# Patient Record
Sex: Female | Born: 1975 | Race: Black or African American | Hispanic: No | Marital: Single | Smoking: Current every day smoker
Health system: Southern US, Community
[De-identification: ages and names within clinical notes are randomized; demographics above are authoritative.]

## PROBLEM LIST (undated history)

## (undated) DIAGNOSIS — I10 Essential (primary) hypertension: Secondary | ICD-10-CM

## (undated) DIAGNOSIS — J45909 Unspecified asthma, uncomplicated: Secondary | ICD-10-CM

## (undated) HISTORY — PX: TUBAL LIGATION: SHX77

---

## 1994-02-11 DIAGNOSIS — B20 Human immunodeficiency virus [HIV] disease: Secondary | ICD-10-CM

## 1997-06-15 ENCOUNTER — Emergency Department (HOSPITAL_COMMUNITY): Admission: EM | Admit: 1997-06-15 | Discharge: 1997-06-15 | Payer: Self-pay | Admitting: Emergency Medicine

## 1997-07-01 ENCOUNTER — Emergency Department (HOSPITAL_COMMUNITY): Admission: EM | Admit: 1997-07-01 | Discharge: 1997-07-01 | Payer: Self-pay | Admitting: *Deleted

## 1997-09-06 ENCOUNTER — Other Ambulatory Visit: Admission: RE | Admit: 1997-09-06 | Discharge: 1997-09-06 | Payer: Self-pay | Admitting: Family Medicine

## 1998-08-16 ENCOUNTER — Emergency Department (HOSPITAL_COMMUNITY): Admission: EM | Admit: 1998-08-16 | Discharge: 1998-08-16 | Payer: Self-pay | Admitting: Emergency Medicine

## 1998-08-16 ENCOUNTER — Encounter: Payer: Self-pay | Admitting: Emergency Medicine

## 1999-01-22 ENCOUNTER — Encounter: Payer: Self-pay | Admitting: Emergency Medicine

## 1999-01-22 ENCOUNTER — Emergency Department (HOSPITAL_COMMUNITY): Admission: EM | Admit: 1999-01-22 | Discharge: 1999-01-22 | Payer: Self-pay | Admitting: Emergency Medicine

## 1999-03-15 ENCOUNTER — Encounter (INDEPENDENT_AMBULATORY_CARE_PROVIDER_SITE_OTHER): Payer: Self-pay | Admitting: *Deleted

## 1999-03-15 LAB — CONVERTED CEMR LAB: CD4 T Cell Abs: 820

## 1999-04-13 ENCOUNTER — Ambulatory Visit (HOSPITAL_COMMUNITY): Admission: RE | Admit: 1999-04-13 | Discharge: 1999-04-13 | Payer: Self-pay | Admitting: Family Medicine

## 1999-05-02 ENCOUNTER — Encounter: Admission: RE | Admit: 1999-05-02 | Discharge: 1999-05-02 | Payer: Self-pay | Admitting: Obstetrics & Gynecology

## 1999-05-16 ENCOUNTER — Encounter: Admission: RE | Admit: 1999-05-16 | Discharge: 1999-05-16 | Payer: Self-pay | Admitting: Obstetrics & Gynecology

## 1999-05-23 ENCOUNTER — Encounter: Admission: RE | Admit: 1999-05-23 | Discharge: 1999-05-23 | Payer: Self-pay | Admitting: Obstetrics & Gynecology

## 1999-05-23 ENCOUNTER — Ambulatory Visit (HOSPITAL_COMMUNITY): Admission: RE | Admit: 1999-05-23 | Discharge: 1999-05-23 | Payer: Self-pay | Admitting: Obstetrics

## 1999-05-30 ENCOUNTER — Ambulatory Visit (HOSPITAL_COMMUNITY): Admission: RE | Admit: 1999-05-30 | Discharge: 1999-05-30 | Payer: Self-pay | Admitting: Obstetrics

## 1999-05-30 ENCOUNTER — Encounter: Admission: RE | Admit: 1999-05-30 | Discharge: 1999-05-30 | Payer: Self-pay | Admitting: Obstetrics & Gynecology

## 1999-06-06 ENCOUNTER — Encounter: Admission: RE | Admit: 1999-06-06 | Discharge: 1999-06-06 | Payer: Self-pay | Admitting: Obstetrics & Gynecology

## 1999-06-11 ENCOUNTER — Encounter: Admission: RE | Admit: 1999-06-11 | Discharge: 1999-06-11 | Payer: Self-pay | Admitting: Internal Medicine

## 1999-06-20 ENCOUNTER — Encounter: Admission: RE | Admit: 1999-06-20 | Discharge: 1999-06-20 | Payer: Self-pay | Admitting: Obstetrics & Gynecology

## 1999-06-20 ENCOUNTER — Encounter (HOSPITAL_COMMUNITY): Admission: RE | Admit: 1999-06-20 | Discharge: 1999-08-31 | Payer: Self-pay | Admitting: Obstetrics & Gynecology

## 1999-06-27 ENCOUNTER — Encounter: Admission: RE | Admit: 1999-06-27 | Discharge: 1999-06-27 | Payer: Self-pay | Admitting: Obstetrics & Gynecology

## 1999-06-27 ENCOUNTER — Ambulatory Visit (HOSPITAL_COMMUNITY): Admission: RE | Admit: 1999-06-27 | Discharge: 1999-06-27 | Payer: Self-pay | Admitting: Obstetrics & Gynecology

## 1999-07-03 ENCOUNTER — Encounter: Admission: RE | Admit: 1999-07-03 | Discharge: 1999-07-03 | Payer: Self-pay | Admitting: Internal Medicine

## 1999-07-11 ENCOUNTER — Encounter: Admission: RE | Admit: 1999-07-11 | Discharge: 1999-07-11 | Payer: Self-pay | Admitting: Obstetrics & Gynecology

## 1999-07-11 ENCOUNTER — Ambulatory Visit (HOSPITAL_COMMUNITY): Admission: RE | Admit: 1999-07-11 | Discharge: 1999-07-11 | Payer: Self-pay | Admitting: Obstetrics

## 1999-07-13 ENCOUNTER — Encounter: Payer: Self-pay | Admitting: Obstetrics & Gynecology

## 1999-07-25 ENCOUNTER — Encounter: Admission: RE | Admit: 1999-07-25 | Discharge: 1999-07-25 | Payer: Self-pay | Admitting: Obstetrics

## 1999-08-01 ENCOUNTER — Encounter: Admission: RE | Admit: 1999-08-01 | Discharge: 1999-08-01 | Payer: Self-pay | Admitting: Obstetrics

## 1999-08-01 ENCOUNTER — Inpatient Hospital Stay (HOSPITAL_COMMUNITY): Admission: AD | Admit: 1999-08-01 | Discharge: 1999-08-01 | Payer: Self-pay | Admitting: *Deleted

## 1999-08-13 ENCOUNTER — Encounter (INDEPENDENT_AMBULATORY_CARE_PROVIDER_SITE_OTHER): Payer: Self-pay | Admitting: *Deleted

## 1999-08-13 ENCOUNTER — Encounter: Admission: RE | Admit: 1999-08-13 | Discharge: 1999-08-13 | Payer: Self-pay | Admitting: Internal Medicine

## 1999-08-22 ENCOUNTER — Encounter: Admission: RE | Admit: 1999-08-22 | Discharge: 1999-08-22 | Payer: Self-pay | Admitting: Obstetrics

## 1999-08-31 ENCOUNTER — Inpatient Hospital Stay (HOSPITAL_COMMUNITY): Admission: AD | Admit: 1999-08-31 | Discharge: 1999-09-03 | Payer: Self-pay | Admitting: *Deleted

## 1999-08-31 ENCOUNTER — Encounter (INDEPENDENT_AMBULATORY_CARE_PROVIDER_SITE_OTHER): Payer: Self-pay | Admitting: Specialist

## 1999-09-25 ENCOUNTER — Encounter: Admission: RE | Admit: 1999-09-25 | Discharge: 1999-09-25 | Payer: Self-pay | Admitting: Internal Medicine

## 2001-07-07 ENCOUNTER — Emergency Department (HOSPITAL_COMMUNITY): Admission: EM | Admit: 2001-07-07 | Discharge: 2001-07-07 | Payer: Self-pay | Admitting: Emergency Medicine

## 2002-11-12 ENCOUNTER — Emergency Department (HOSPITAL_COMMUNITY): Admission: EM | Admit: 2002-11-12 | Discharge: 2002-11-12 | Payer: Self-pay | Admitting: Emergency Medicine

## 2003-07-13 ENCOUNTER — Emergency Department (HOSPITAL_COMMUNITY): Admission: EM | Admit: 2003-07-13 | Discharge: 2003-07-14 | Payer: Self-pay | Admitting: Emergency Medicine

## 2004-02-28 ENCOUNTER — Encounter (INDEPENDENT_AMBULATORY_CARE_PROVIDER_SITE_OTHER): Payer: Self-pay | Admitting: *Deleted

## 2004-02-28 ENCOUNTER — Ambulatory Visit (HOSPITAL_COMMUNITY): Admission: RE | Admit: 2004-02-28 | Discharge: 2004-02-28 | Payer: Self-pay | Admitting: Internal Medicine

## 2004-02-28 ENCOUNTER — Ambulatory Visit: Payer: Self-pay | Admitting: Internal Medicine

## 2004-02-28 LAB — CONVERTED CEMR LAB: CD4 Count: 740 microliters

## 2004-03-27 ENCOUNTER — Ambulatory Visit: Payer: Self-pay | Admitting: Internal Medicine

## 2004-11-27 ENCOUNTER — Emergency Department (HOSPITAL_COMMUNITY): Admission: EM | Admit: 2004-11-27 | Discharge: 2004-11-27 | Payer: Self-pay | Admitting: Emergency Medicine

## 2004-11-30 ENCOUNTER — Emergency Department (HOSPITAL_COMMUNITY): Admission: EM | Admit: 2004-11-30 | Discharge: 2004-11-30 | Payer: Self-pay | Admitting: Family Medicine

## 2004-12-03 ENCOUNTER — Emergency Department (HOSPITAL_COMMUNITY): Admission: EM | Admit: 2004-12-03 | Discharge: 2004-12-03 | Payer: Self-pay | Admitting: Emergency Medicine

## 2005-03-02 ENCOUNTER — Emergency Department (HOSPITAL_COMMUNITY): Admission: EM | Admit: 2005-03-02 | Discharge: 2005-03-02 | Payer: Self-pay | Admitting: Emergency Medicine

## 2005-11-19 ENCOUNTER — Encounter: Admission: RE | Admit: 2005-11-19 | Discharge: 2005-11-19 | Payer: Self-pay | Admitting: Internal Medicine

## 2005-11-19 ENCOUNTER — Encounter (INDEPENDENT_AMBULATORY_CARE_PROVIDER_SITE_OTHER): Payer: Self-pay | Admitting: *Deleted

## 2005-11-19 ENCOUNTER — Ambulatory Visit: Payer: Self-pay | Admitting: Internal Medicine

## 2005-11-19 DIAGNOSIS — E669 Obesity, unspecified: Secondary | ICD-10-CM | POA: Insufficient documentation

## 2005-11-19 DIAGNOSIS — F17200 Nicotine dependence, unspecified, uncomplicated: Secondary | ICD-10-CM | POA: Insufficient documentation

## 2005-11-19 DIAGNOSIS — D696 Thrombocytopenia, unspecified: Secondary | ICD-10-CM

## 2005-11-19 LAB — CONVERTED CEMR LAB
HIV 1 RNA Quant: 268 copies/mL
HIV-1 RNA Quant, Log: 2.43 — ABNORMAL HIGH (ref <1.70)

## 2005-12-12 DIAGNOSIS — I10 Essential (primary) hypertension: Secondary | ICD-10-CM | POA: Insufficient documentation

## 2006-04-07 ENCOUNTER — Encounter (INDEPENDENT_AMBULATORY_CARE_PROVIDER_SITE_OTHER): Payer: Self-pay | Admitting: *Deleted

## 2006-04-07 LAB — CONVERTED CEMR LAB

## 2006-04-20 ENCOUNTER — Encounter (INDEPENDENT_AMBULATORY_CARE_PROVIDER_SITE_OTHER): Payer: Self-pay | Admitting: *Deleted

## 2006-12-09 ENCOUNTER — Telehealth: Payer: Self-pay | Admitting: Internal Medicine

## 2007-01-26 ENCOUNTER — Encounter (INDEPENDENT_AMBULATORY_CARE_PROVIDER_SITE_OTHER): Payer: Self-pay | Admitting: *Deleted

## 2007-01-27 ENCOUNTER — Ambulatory Visit: Payer: Self-pay | Admitting: Internal Medicine

## 2007-01-27 ENCOUNTER — Encounter: Admission: RE | Admit: 2007-01-27 | Discharge: 2007-01-27 | Payer: Self-pay | Admitting: Internal Medicine

## 2007-01-27 LAB — CONVERTED CEMR LAB
Albumin: 3.9 g/dL (ref 3.5–5.2)
BUN: 10 mg/dL (ref 6–23)
CO2: 22 meq/L (ref 19–32)
Calcium: 8.8 mg/dL (ref 8.4–10.5)
Chloride: 107 meq/L (ref 96–112)
Eosinophils Absolute: 0.2 10*3/uL (ref 0.2–0.7)
Glucose, Bld: 92 mg/dL (ref 70–99)
HDL: 37 mg/dL — ABNORMAL LOW (ref >39)
HIV-1 RNA Quant, Log: 2.87 — ABNORMAL HIGH (ref <1.70)
Lymphocytes Relative: 61 % — ABNORMAL HIGH (ref 12–46)
Lymphs Abs: 3.1 10*3/uL (ref 0.7–4.0)
MCV: 79.5 fL (ref 78.0–100.0)
Monocytes Relative: 7 % (ref 3–12)
Neutro Abs: 1.4 10*3/uL — ABNORMAL LOW (ref 1.7–7.7)
Neutrophils Relative %: 27 % — ABNORMAL LOW (ref 43–77)
Potassium: 4.4 meq/L (ref 3.5–5.3)
RBC: 4.88 M/uL (ref 3.87–5.11)
Sodium: 138 meq/L (ref 135–145)
Total Protein: 7.7 g/dL (ref 6.0–8.3)
Triglycerides: 72 mg/dL (ref <150)
WBC: 5.1 10*3/uL (ref 4.0–10.5)

## 2007-04-06 ENCOUNTER — Emergency Department (HOSPITAL_COMMUNITY): Admission: EM | Admit: 2007-04-06 | Discharge: 2007-04-06 | Payer: Self-pay | Admitting: Family Medicine

## 2007-07-08 ENCOUNTER — Encounter: Payer: Self-pay | Admitting: Internal Medicine

## 2008-04-05 ENCOUNTER — Emergency Department (HOSPITAL_COMMUNITY): Admission: EM | Admit: 2008-04-05 | Discharge: 2008-04-05 | Payer: Self-pay | Admitting: Emergency Medicine

## 2008-04-26 ENCOUNTER — Encounter: Payer: Self-pay | Admitting: Internal Medicine

## 2008-04-27 ENCOUNTER — Encounter (INDEPENDENT_AMBULATORY_CARE_PROVIDER_SITE_OTHER): Payer: Self-pay | Admitting: *Deleted

## 2008-05-06 ENCOUNTER — Ambulatory Visit: Payer: Self-pay | Admitting: Internal Medicine

## 2008-05-06 ENCOUNTER — Other Ambulatory Visit: Payer: Self-pay | Admitting: Internal Medicine

## 2008-05-06 ENCOUNTER — Telehealth: Payer: Self-pay | Admitting: Internal Medicine

## 2008-05-06 ENCOUNTER — Encounter: Payer: Self-pay | Admitting: Internal Medicine

## 2008-05-06 LAB — CONVERTED CEMR LAB
ALT: 18 units/L (ref 0–35)
Albumin: 3.8 g/dL (ref 3.5–5.2)
Alkaline Phosphatase: 101 units/L (ref 39–117)
Basophils Absolute: 0 10*3/uL (ref 0.0–0.1)
Basophils Relative: 0 % (ref 0–1)
CO2: 22 meq/L (ref 19–32)
Eosinophils Relative: 3 % (ref 0–5)
GFR calc Af Amer: >60 mL/min (ref >60)
GFR calc non Af Amer: >60 mL/min (ref >60)
Glucose, Bld: 94 mg/dL (ref 70–99)
HCT: 35.8 % — ABNORMAL LOW (ref 36.0–46.0)
HIV 1 RNA Quant: 1230 copies/mL — ABNORMAL HIGH (ref <48)
HIV-1 RNA Quant, Log: 3.09 — ABNORMAL HIGH (ref <1.68)
Hemoglobin: 12.9 g/dL (ref 12.0–15.0)
LDL Cholesterol: 50 mg/dL (ref 0–99)
Lymphocytes Relative: 46 % (ref 12–46)
MCHC: 36 g/dL (ref 30.0–36.0)
Monocytes Absolute: 0.9 10*3/uL (ref 0.1–1.0)
Platelets: 101 10*3/uL — ABNORMAL LOW (ref 150–400)
Potassium: 3.9 meq/L (ref 3.5–5.3)
RDW: 14.4 % (ref 11.5–15.5)
Sodium: 141 meq/L (ref 135–145)
Total Bilirubin: 0.3 mg/dL (ref 0.3–1.2)
Total Protein: 7.5 g/dL (ref 6.0–8.3)
VLDL: 25 mg/dL (ref 0–40)

## 2008-05-11 ENCOUNTER — Encounter: Payer: Self-pay | Admitting: Internal Medicine

## 2008-07-05 ENCOUNTER — Telehealth (INDEPENDENT_AMBULATORY_CARE_PROVIDER_SITE_OTHER): Payer: Self-pay | Admitting: *Deleted

## 2008-09-10 ENCOUNTER — Emergency Department (HOSPITAL_COMMUNITY): Admission: EM | Admit: 2008-09-10 | Discharge: 2008-09-10 | Payer: Self-pay | Admitting: Emergency Medicine

## 2008-09-12 ENCOUNTER — Emergency Department (HOSPITAL_COMMUNITY): Admission: EM | Admit: 2008-09-12 | Discharge: 2008-09-12 | Payer: Self-pay | Admitting: Emergency Medicine

## 2008-09-14 ENCOUNTER — Emergency Department (HOSPITAL_COMMUNITY): Admission: EM | Admit: 2008-09-14 | Discharge: 2008-09-14 | Payer: Self-pay | Admitting: Emergency Medicine

## 2008-09-19 ENCOUNTER — Emergency Department (HOSPITAL_COMMUNITY): Admission: EM | Admit: 2008-09-19 | Discharge: 2008-09-19 | Payer: Self-pay | Admitting: Family Medicine

## 2009-02-27 ENCOUNTER — Emergency Department (HOSPITAL_COMMUNITY): Admission: EM | Admit: 2009-02-27 | Discharge: 2009-02-27 | Payer: Self-pay | Admitting: Emergency Medicine

## 2009-03-02 ENCOUNTER — Emergency Department (HOSPITAL_COMMUNITY): Admission: EM | Admit: 2009-03-02 | Discharge: 2009-03-02 | Payer: Self-pay | Admitting: Emergency Medicine

## 2009-08-18 ENCOUNTER — Encounter: Payer: Self-pay | Admitting: Internal Medicine

## 2009-11-02 ENCOUNTER — Encounter: Payer: Self-pay | Admitting: Internal Medicine

## 2009-11-07 ENCOUNTER — Encounter: Payer: Self-pay | Admitting: Internal Medicine

## 2009-11-15 ENCOUNTER — Ambulatory Visit: Payer: Self-pay | Admitting: Internal Medicine

## 2009-11-15 LAB — CONVERTED CEMR LAB
ALT: 18 units/L (ref 0–35)
AST: 22 units/L (ref 0–37)
Albumin: 3.7 g/dL (ref 3.5–5.2)
Basophils Absolute: 0 10*3/uL (ref 0.0–0.1)
Basophils Relative: 0 % (ref 0–1)
CO2: 25 meq/L (ref 19–32)
Calcium: 8.4 mg/dL (ref 8.4–10.5)
Chloride: 108 meq/L (ref 96–112)
Lymphocytes Relative: 54 % — ABNORMAL HIGH (ref 12–46)
MCHC: 35.4 g/dL (ref 30.0–36.0)
Monocytes Relative: 5 % (ref 3–12)
Neutro Abs: 2.5 10*3/uL (ref 1.7–7.7)
Neutrophils Relative %: 37 % — ABNORMAL LOW (ref 43–77)
Potassium: 3.7 meq/L (ref 3.5–5.3)
RBC: 4.4 M/uL (ref 3.87–5.11)
RDW: 13.8 % (ref 11.5–15.5)

## 2009-11-27 ENCOUNTER — Emergency Department (HOSPITAL_COMMUNITY): Admission: EM | Admit: 2009-11-27 | Discharge: 2009-11-27 | Payer: Self-pay | Admitting: Emergency Medicine

## 2009-11-28 ENCOUNTER — Telehealth (INDEPENDENT_AMBULATORY_CARE_PROVIDER_SITE_OTHER): Payer: Self-pay | Admitting: *Deleted

## 2009-11-28 ENCOUNTER — Encounter: Payer: Self-pay | Admitting: Internal Medicine

## 2009-12-14 ENCOUNTER — Ambulatory Visit: Payer: Self-pay | Admitting: Internal Medicine

## 2009-12-14 DIAGNOSIS — A5901 Trichomonal vulvovaginitis: Secondary | ICD-10-CM

## 2010-01-12 ENCOUNTER — Ambulatory Visit: Payer: Self-pay | Admitting: Internal Medicine

## 2010-01-23 ENCOUNTER — Ambulatory Visit: Payer: Self-pay | Admitting: Internal Medicine

## 2010-03-14 ENCOUNTER — Encounter (INDEPENDENT_AMBULATORY_CARE_PROVIDER_SITE_OTHER): Payer: Self-pay | Admitting: *Deleted

## 2010-03-15 NOTE — Miscellaneous (Signed)
Summary: Orders Update - labs  Clinical Lists Changes  Orders: Added new Test order of T-CD4SP [facility]) (CD4SP) - Signed

## 2010-03-15 NOTE — Miscellaneous (Signed)
Summary: Orders Update - labs  Clinical Lists Changes  Orders: Added new Test order of T-CBC w/Diff [phone]) - Signed Added new Test order of T-CD4SP [facility]) (CD4SP) - Signed Added new Test order of T-GC Probe, urine [phone]) - Signed Added new Test order of T-Chlamydia  Probe, urine [phone]) - Signed Added new Test order of T-Comprehensive Metabolic Panel ([id number]) - Signed Added new Test order of T-HIV1 Quant rflx Ultra or Genotype ([id number]) - Signed Added new Test order of T-RPR (Syphilis) ([id number]) - Signed     Process Orders Check Orders Results:     [facility]: ABN not required for this insurance Tests Sent for requisitioning (November 07, 2009 3:51 PM):     11/15/2009: [facility] -- T-CBC w/Diff [[id number]] (signed)     11/15/2009: [facility] -- T-GC Probe, urine [phone] (signed)     11/15/2009: [facility] -- T-Chlamydia  Probe, urine [phone] (signed)     11/15/2009: [facility] -- T-Comprehensive Metabolic Panel [[id number]] (signed)     11/15/2009: [facility] -- T-HIV1 Quant rflx Ultra or Genotype [phone] (signed)     11/15/2009: [facility] -- T-RPR (Syphilis) [phone] (signed)

## 2010-03-15 NOTE — Progress Notes (Signed)
Summary: lab appt. 11/29/09 @ 2:00pm  Phone Note Outgoing Call   Call placed by: Jennet Maduro RN,  November 28, 2009 4:44 PM Call placed to: Patient Action Taken: Phone Call Completed, Appt scheduled Summary of Call: Message left for pt. reminding her of lab appt. for tomorrow, Wed., Oct. 19, 2011 @ 2:00 pm. Jennet Maduro RN  November 28, 2009 4:44 PM

## 2010-03-15 NOTE — Miscellaneous (Signed)
Summary: Orders Update - BMET  Clinical Lists Changes  Orders: Added new Test order of T-Basic Metabolic Panel [phone]) - Signed     Process Orders Check Orders Results:     [facility]: ABN not required for this insurance Tests Sent for requisitioning (November 28, 2009 4:40 PM):     11/29/2009: [facility] -- T-Basic Metabolic Panel [phone] (signed)

## 2010-03-15 NOTE — Miscellaneous (Signed)
Summary: Orders Update - referral for [facility]  Clinical Lists Changes  Orders: Added new Referral order of Misc. Referral (Misc. Ref) - Signed

## 2010-03-15 NOTE — Letter (Signed)
Summary: [facility] for Infectious Disease  [address]   [place], [place] [address]   Phone: [phone]  Fax: [phone]          August 18, 2009  [facility] [place] [address] [place], [place]  [address]  Dear Ms. Miron,  I attempted to reach you by phone today but was unable to leave you a message or speak with you.  Please call the number above and follow the prompts to reschedule your lab and PAP smear appointment.  Dr. Orvan Falconer will be able to better advise you when you come for your office visit on Thursday, July 14th at 10:30 when you lab values are available to him at the time of your visit.  This will save you time and transportation costs if you do not need to return to the clinic several times.  Please call the number above to reschedule these appointments.  Thank you.  Sincerely,    Jennet Maduro [facility] for Infectious Disease [address], [address] (across from the Emergency Department at [facility]

## 2010-03-15 NOTE — Assessment & Plan Note (Signed)
Summary: F/U OV/VS   CC:  follow-up visit and Depression.  History of Present Illness: Melissa Sloan is in for her first visit in 3 years.  She states that she has not been keeping her visits because she was afraid she would be able to pay her bills here and she had trouble with transportation.  However she does not have any problem taking the bus to work each day or getting the bus here today.  She says she is aware that we will work with her on any pills and want to see her regardless of her ability to pay.  She recently felt like she had a cold with fever and the car she did not have a car she called the ambulance to take her to the hospital.  She was seen in the emergency department and had a slightly low potassium which was repleted.  She mentioned a vaginal discharge and was found to have Trichomonas from which she was treated with Flagyl.  Her fianc has not been evaluated or treated yet and she is still having some vaginal discharge and itching.  They have been sexually active but she says he is HIV negative and that he always uses condoms.  She had been out of her hydrochlorothiazide for several years but restarted it recently.  Depression History:      The patient denies a depressed mood most of the day and a diminished interest in her usual daily activities.         Preventive Screening-Counseling & Management  Alcohol-Tobacco     Alcohol drinks/day: <1     Alcohol type: beer     Smoking Status: current     Smoking Cessation Counseling: yes     Smoke Cessation Stage: precontemplative     Packs/Day: 1/2ppd  Caffeine-Diet-Exercise     Caffeine use/day: 5+     Does Patient Exercise: yes     Type of exercise: walking, 20 minutes     Times/week: 7  Comments: interested stopping smoking  Hep-HIV-STD-Contraception     HIV Risk: no     HIV Risk Counseling: not indicated-no HIV risk noted  Safety-Violence-Falls     Seat Belt Use: 100  Comments: given condoms      Sexual History:   currently monogamous.        Drug Use:  former.     Updated Prior Medication List: HYDROCHLOROTHIAZIDE 50 MG TABS (HYDROCHLOROTHIAZIDE) Take 1 tablet by mouth once a day K-TABS 10 MEQ CR-TABS (POTASSIUM CHLORIDE) Take 1 tablet by mouth once a day  Current Allergies (reviewed today): No known allergies  Social History: Sexual History:  currently monogamous Drug Use:  former  Vital Signs:  Patient profile:   35 year old female Menstrual status:  regular LMP:     12/12/2009 Height:      62.5 inches (158.75 cm) Weight:      217.5 pounds (98.86 kg) BMI:     39.29 Temp:     98.5 degrees F (36.94 degrees C) oral Pulse rate:   86 / minute BP sitting:   139 / 96  (left arm) Cuff size:   large  Vitals Entered By: Jennet Maduro RN (December 14, 2009 11:06 AM) CC: follow-up visit, Depression Is Patient Diabetic? No Pain Assessment Patient in pain? no      Nutritional Status BMI of > 30 = obese Nutritional Status Detail appetite "I eat.  Have you ever been in a relationship where you felt threatened, hurt or afraid?No  Does patient need assistance? Functional Status Self care Ambulation Normal LMP (date): 12/12/2009 LMP - Character: normal    Menses interval (days): 30 Menstrual flow (days): 5 Enter LMP: 12/12/2009 Last PAP Result NEGATIVE FOR INTRAEPITHELIAL LESIONS OR MALIGNANCY.   Physical Exam  General:  alert and overweight-appearing.  alert.   Mouth:  pharynx pink and moist, no erythema, no exudates, and fair dentition.  pharynx pink and moist, no erythema, no exudates, and fair dentition.   Lungs:  normal breath sounds, no crackles, and no wheezes.  normal breath sounds, no crackles, and no wheezes.   Heart:  normal rate, regular rhythm, and no murmur.  normal rate, regular rhythm, and no murmur.   Abdomen:  soft, non-tender, normal bowel sounds, and no masses.  soft, non-tender, normal bowel sounds, and no masses.   Skin:  she has one time sized skin lesion on  her right shoulder with some central clearing and peripheral scale compatible with very mild tinea infection Inguinal Nodes:  no R inguinal adenopathy and no L inguinal adenopathy.  no R inguinal adenopathy and no L inguinal adenopathy.   Psych:  normally interactive, good eye contact, not anxious appearing, and not depressed appearing.  she is very loud and boisterous but todaynormally interactive, good eye contact, not anxious appearing, and not depressed appearing.          Medication Adherence: 12/14/2009   Adherence to medications reviewed with patient. Counseling to provide adequate adherence provided   Prevention For Positives: 12/14/2009   Safe sex practices discussed with patient. Condoms offered.         12/14/2009   Patient was screened for substance abuse and depression. Referal was made as indicated.                      Impression & Recommendations:  Problem # 1:  HIV DISEASE (ICD-042) Despite long absences from care her HIV remains stable and asymptomatic.  I will not start any antiretroviral therapy but will continue to work with her on staying in care. Diagnostics Reviewed:  HIV: HIV positive - not AIDS (04/27/2008)   CD4: 650 (11/17/2009)   WBC: 6.6 (11/15/2009)   Hgb: 12.3 (11/15/2009)   HCT: 34.7 (11/15/2009)   Platelets: 154 (11/15/2009) HIV genotype: See Comment (11/15/2009)   HIV-1 RNA: 2510 (11/15/2009)   HBSAg: NO (04/07/2006)    Her updated medication list for this problem includes:    Flagyl 500 Mg Tabs (Metronidazole) .Marland Kitchen... Take 4 tablets by mouth once  Problem # 2:  HYPERTENSION (ICD-401.9) I will continue her hydrochlorothiazide today, recheck her potassium and encouraged her to return soon for regular visits. Her updated medication list for this problem includes:    Hydrochlorothiazide 50 Mg Tabs (Hydrochlorothiazide) .Marland Kitchen... Take 1 tablet by mouth once a day    Her updated medication list for this problem includes:    Hydrochlorothiazide 50 Mg  Tabs (Hydrochlorothiazide) .Marland Kitchen... Take 1 tablet by mouth once a day  Orders: Est. Patient Level IV ([address])  Problem # 3:  TRICHOMONAL VAGINITIS (ICD-131.01)  I will treat her again for Trichomonas and I have strongly encouraged her to have her fianc to the health department for evaluation and treatment for STDs.  Orders: Est. Patient Level IV ([address])  Medications Added to Medication List This Visit: 1)  K-tabs 10 Meq Cr-tabs (Potassium chloride) .... Take 1 tablet by mouth once a day  Other Orders: Influenza Vaccine NON MCR ([address]) Pneumococcal Vaccine ([address]1478) Admin 1st Vaccine (  [address])  Patient Instructions: 1)  Please schedule an appointment for the Pap smear clinic. 2)  Please schedule a follow-up appointment in 6 weeks.    Immunizations Administered:  Influenza Vaccine # 1:    Vaccine Type: Fluvax Non-MCR    Site: left deltoid    Mfr: novartis    Dose: 0.5 ml    Route: IM    Given by: Jennet Maduro RN    Exp. Date: 05/13/2010    Lot #: [id number]  Pneumonia Vaccine:    Vaccine Type: Pneumovax    Site: right deltoid    Mfr: Merck    Dose: 0.5 ml    Route: IM    Given by: Jennet Maduro RN    Exp. Date: 06/05/2011    Lot #: [id number]  Flu Vaccine Consent Questions:    Do you have a history of severe allergic reactions to this vaccine? no    Any prior history of allergic reactions to egg and/or gelatin? no    Do you have a sensitivity to the preservative Thimersol? no    Do you have a past history of Guillan-Barre Syndrome? no    Do you currently have an acute febrile illness? no    Have you ever had a severe reaction to latex? no    Vaccine information given and explained to patient? yes    Are you currently pregnant? no Prescriptions: FLAGYL 500 MG TABS (METRONIDAZOLE) Take 4 tablets by mouth once  #4 x 0   Entered and Authorized by:   Cliffton Asters MD   Signed by:   Cliffton Asters MD on 12/14/2009   Method used:   Print then Give to Patient   RxID:    [id number]

## 2010-03-21 NOTE — Letter (Signed)
Summary: [facility] for Infectious Disease  [address]   [place], [place] [address]   Phone: [phone]  Fax: [phone]          March 15, 2010  [facility] [place] [address] [place], [place]  [address]  Dear Ms. Thurow,  Dr. Orvan Falconer is very interested in seeing you back at the clinic for labs and an office visit.  It is also time to schedule an appointment for your annual PAP smear.  Please call the phone number listed above and chose Option 2 to schedule these very important appointments.  We hope to see you very soon here at the Center.  Sincerely,    Jennet Maduro [facility] for Infectious Disease

## 2010-04-25 LAB — DIFFERENTIAL
Eosinophils Relative: 0 % (ref 0–5)
Monocytes Absolute: 1.5 10*3/uL — ABNORMAL HIGH (ref 0.1–1.0)
Monocytes Relative: 13 % — ABNORMAL HIGH (ref 3–12)
Neutrophils Relative %: 68 % (ref 43–77)

## 2010-04-25 LAB — COMPREHENSIVE METABOLIC PANEL
ALT: 36 U/L — ABNORMAL HIGH (ref 0–35)
BUN: 7 mg/dL (ref 6–23)
CO2: 32 mEq/L (ref 19–32)
Calcium: 8.7 mg/dL (ref 8.4–10.5)
Creatinine, Ser: 1.05 mg/dL (ref 0.4–1.2)
GFR calc non Af Amer: 59 mL/min — ABNORMAL LOW (ref >60)
Glucose, Bld: 85 mg/dL (ref 70–99)

## 2010-04-25 LAB — URINALYSIS, ROUTINE W REFLEX MICROSCOPIC
Bilirubin Urine: NEGATIVE
Nitrite: NEGATIVE
Specific Gravity, Urine: 1.009 (ref 1.005–1.030)
Urobilinogen, UA: 2 mg/dL — ABNORMAL HIGH (ref 0.0–1.0)

## 2010-04-25 LAB — URINE CULTURE
Colony Count: 10000
Culture  Setup Time: 201110172040

## 2010-04-25 LAB — CBC
HCT: 34.4 % — ABNORMAL LOW (ref 36.0–46.0)
Hemoglobin: 12.4 g/dL (ref 12.0–15.0)
MCH: 27.9 pg (ref 26.0–34.0)
MCHC: 36 g/dL (ref 30.0–36.0)
MCV: 77.3 fL — ABNORMAL LOW (ref 78.0–100.0)

## 2010-04-25 LAB — URINE MICROSCOPIC-ADD ON

## 2010-04-25 LAB — GC/CHLAMYDIA PROBE AMP, GENITAL: Chlamydia, DNA Probe: NEGATIVE

## 2010-04-26 LAB — T-HELPER CELL (CD4) - (RCID CLINIC ONLY)
CD4 % Helper T Cell: 19 % — ABNORMAL LOW (ref 33–55)
CD4 T Cell Abs: 650 uL (ref 400–2700)

## 2010-04-29 LAB — CULTURE, ROUTINE-ABSCESS

## 2010-05-24 LAB — T-HELPER CELL (CD4) - (RCID CLINIC ONLY): CD4 T Cell Abs: 580 uL (ref 400–2700)

## 2010-06-29 NOTE — Op Note (Signed)
[facility]  Patient:    Melissa Sloan, Melissa Sloan                   MRN: [id number] Proc. Date: 09/02/99 Adm. Date:  [id number] Disc. Date: [id number] Attending:  Michaelle Copas                           Operative Report  PREOPERATIVE DIAGNOSIS:       Desires sterilization.  POSTOPERATIVE DIAGNOSIS:      Desires sterilization.  OPERATION:                    Bilateral partial salpingectomy ([place]                               technique).  SURGEON:                      Charles A. Clearance Coots, M.D.  ANESTHESIA:                   General.  ESTIMATED BLOOD LOSS:         Negligible.  COMPLICATIONS:                None.  SPECIMENS:                    Approximately 2 cm segments of right and left                               fallopian tubes.  DESCRIPTION OF PROCEDURE:     The patient was brought to the operating room and after satisfactory general endotracheal anesthesia, the abdomen was prepped and draped in the usual sterile fashion.  A small anterior umbilical incision was made with the scalpel that was deepened down to the fascia with curved Mayo scissors.  The fascia was grasped in the midline with two Kelly forceps and was cut transversely with curved Mayo scissors.  The fascial incision was extended to the left and to the right with the curved Mayo scissors.  The peritoneum was grasped with hemostats and was incised with Metzenbaum scissors.  Right angle retractors were placed in the incision and the right fallopian tube was identified and was grasped with the Babcock clamp.  The tube was followed from the cornual end to the fimbrial end, and _________ with Babcock clamp.  The tube was then followed in a retrograde fashion back to the isthmic area of the tube with the Babcock clamps, and a knuckle of tube beneath the Babcock clamp and the isthmic area of the tube was doubly ligated with #1 plain catgut and a section of tube above the knot  was excised with Metzenbaum scissors and submitted to pathology for evaluation.                                The same procedure was performed on the opposite side without complication.  The abdomen was then closed as follows.  The fascia and peritoneum was closed as one with a continuous suture of 2-0 Vicryl.  The subcutaneous tissue was approximated with interrupted suture of 2-0 Vicryl.  The skin was closed with continuous subcuticular suture of 4-0 Monocryl.  Sterile bandages applied to the  incision closure.                                The surgical technician indicated that all needle, sponge, and instrument counts were correct.  The patient tolerated the procedure well and was transported to the recovery room in satisfactory condition. DD:  09/02/99 TD:  09/04/99 Job: [id number]

## 2010-11-16 LAB — T-HELPER CELL (CD4) - (RCID CLINIC ONLY): CD4 % Helper T Cell: 22 — ABNORMAL LOW

## 2011-03-29 ENCOUNTER — Other Ambulatory Visit: Payer: Self-pay | Admitting: Internal Medicine

## 2011-05-20 ENCOUNTER — Other Ambulatory Visit: Payer: Self-pay | Admitting: *Deleted

## 2011-05-20 ENCOUNTER — Telehealth: Payer: Self-pay | Admitting: *Deleted

## 2011-05-20 DIAGNOSIS — Z79899 Other long term (current) drug therapy: Secondary | ICD-10-CM

## 2011-05-20 DIAGNOSIS — Z113 Encounter for screening for infections with a predominantly sexual mode of transmission: Secondary | ICD-10-CM

## 2011-05-20 DIAGNOSIS — B20 Human immunodeficiency virus [HIV] disease: Secondary | ICD-10-CM

## 2011-05-20 NOTE — Telephone Encounter (Signed)
Patient called and advised she needs an appointment. [place] Spaniel that she stopped coming because she lost her Medicaid. Advised her that if that happens again to call us and we can try to help her with ADAP. Advised her to never stop coming before asking for help we do not want her to stop taking her meds if we can help it. Transferred her to the front to get a lab appointment and a 2 week provider appt.

## 2011-05-27 ENCOUNTER — Other Ambulatory Visit: Payer: Self-pay

## 2011-06-10 ENCOUNTER — Other Ambulatory Visit: Payer: Self-pay | Admitting: Internal Medicine

## 2011-06-10 ENCOUNTER — Ambulatory Visit: Payer: Self-pay | Admitting: Internal Medicine

## 2011-06-10 DIAGNOSIS — B20 Human immunodeficiency virus [HIV] disease: Secondary | ICD-10-CM

## 2011-06-13 ENCOUNTER — Ambulatory Visit: Payer: Self-pay | Admitting: Internal Medicine

## 2011-06-18 ENCOUNTER — Telehealth: Payer: Self-pay

## 2011-06-18 NOTE — Telephone Encounter (Signed)
Per note from [place], patient lost her Medicaid - called and left patient a message to call me about doing ADAP and RW while she is here for her lab/pap smear on 06/19/11.

## 2011-06-19 ENCOUNTER — Other Ambulatory Visit (INDEPENDENT_AMBULATORY_CARE_PROVIDER_SITE_OTHER): Payer: Self-pay

## 2011-06-19 ENCOUNTER — Ambulatory Visit (INDEPENDENT_AMBULATORY_CARE_PROVIDER_SITE_OTHER): Payer: Self-pay | Admitting: *Deleted

## 2011-06-19 DIAGNOSIS — Z124 Encounter for screening for malignant neoplasm of cervix: Secondary | ICD-10-CM

## 2011-06-19 DIAGNOSIS — Z113 Encounter for screening for infections with a predominantly sexual mode of transmission: Secondary | ICD-10-CM

## 2011-06-19 DIAGNOSIS — O98519 Other viral diseases complicating pregnancy, unspecified trimester: Secondary | ICD-10-CM

## 2011-06-19 DIAGNOSIS — Z79899 Other long term (current) drug therapy: Secondary | ICD-10-CM

## 2011-06-19 DIAGNOSIS — B20 Human immunodeficiency virus [HIV] disease: Secondary | ICD-10-CM

## 2011-06-19 DIAGNOSIS — I10 Essential (primary) hypertension: Secondary | ICD-10-CM

## 2011-06-19 LAB — CBC WITH DIFFERENTIAL/PLATELET
Eosinophils Absolute: 0.2 10*3/uL (ref 0.0–0.7)
Eosinophils Relative: 3 % (ref 0–5)
HCT: 38.4 % (ref 36.0–46.0)
Lymphocytes Relative: 65 % — ABNORMAL HIGH (ref 12–46)
Lymphs Abs: 4.2 10*3/uL — ABNORMAL HIGH (ref 0.7–4.0)
MCH: 28.4 pg (ref 26.0–34.0)
MCV: 77.3 fL — ABNORMAL LOW (ref 78.0–100.0)
Monocytes Absolute: 0.4 10*3/uL (ref 0.1–1.0)
Platelets: 111 10*3/uL — ABNORMAL LOW (ref 150–400)
RBC: 4.97 MIL/uL (ref 3.87–5.11)
RDW: 14.1 % (ref 11.5–15.5)
WBC: 6.4 10*3/uL (ref 4.0–10.5)

## 2011-06-19 LAB — COMPLETE METABOLIC PANEL WITH GFR
BUN: 9 mg/dL (ref 6–23)
CO2: 23 mEq/L (ref 19–32)
Calcium: 8.7 mg/dL (ref 8.4–10.5)
Chloride: 106 mEq/L (ref 96–112)
Creat: 0.73 mg/dL (ref 0.50–1.10)
GFR, Est African American: >89 mL/min
GFR, Est Non African American: >89 mL/min
Total Bilirubin: 0.4 mg/dL (ref 0.3–1.2)

## 2011-06-19 LAB — LIPID PANEL
Cholesterol: 135 mg/dL (ref 0–200)
HDL: 37 mg/dL — ABNORMAL LOW (ref >39)
Total CHOL/HDL Ratio: 3.6 Ratio

## 2011-06-19 LAB — RPR

## 2011-06-19 MED ORDER — HYDROCHLOROTHIAZIDE 50 MG PO TABS: 50.0000 mg | ORAL_TABLET | Freq: Every day | ORAL | Status: DC

## 2011-06-19 NOTE — Patient Instructions (Signed)
  Your results will be ready in about a week.  I will either call or mail them to you.  Thank you for coming to the Center for your care.  Melissa Sloan  

## 2011-06-19 NOTE — Progress Notes (Signed)
  Subjective:     Melissa Sloan is a 36 y.o. woman who comes in today for a  pap smear only.  Contraception:  BTL, condoms Objective:    There were no vitals taken for this visit.  LMP:  06/04/2011. Pelvic Exam:  Pap smear obtained.   Assessment:    Screening pap smear.   Plan:    Follow up in one year, or as indicated by Pap results.   Pt given condoms.  Pt given educational materials re:  HIV in women, BSE, diet, nutrition, PAP smears and self-esteem.

## 2011-06-20 LAB — HIV-1 RNA QUANT-NO REFLEX-BLD
HIV 1 RNA Quant: 4908 copies/mL — ABNORMAL HIGH (ref <20)
HIV-1 RNA Quant, Log: 3.69 {Log} — ABNORMAL HIGH (ref <1.30)

## 2011-06-20 LAB — T-HELPER CELL (CD4) - (RCID CLINIC ONLY): CD4 T Cell Abs: 820 uL (ref 400–2700)

## 2011-06-21 ENCOUNTER — Encounter: Payer: Self-pay | Admitting: *Deleted

## 2011-07-01 ENCOUNTER — Telehealth: Payer: Self-pay | Admitting: *Deleted

## 2011-07-01 NOTE — Telephone Encounter (Signed)
I called to remind her of her appt tomorrow. She has had a death in her family & cannot come. I transferred her to Vikki Ports to change the appt

## 2011-07-02 ENCOUNTER — Ambulatory Visit: Payer: Self-pay | Admitting: Internal Medicine

## 2011-07-09 ENCOUNTER — Ambulatory Visit: Payer: Self-pay | Admitting: Internal Medicine

## 2011-07-09 ENCOUNTER — Telehealth: Payer: Self-pay | Admitting: *Deleted

## 2011-07-09 NOTE — Telephone Encounter (Signed)
Message left asking pt to call [facility], select Option #1 to make a new appt w/ Dr. Orvan Falconer.

## 2011-08-01 ENCOUNTER — Ambulatory Visit: Payer: Self-pay | Admitting: Internal Medicine

## 2011-08-05 ENCOUNTER — Other Ambulatory Visit: Payer: Self-pay | Admitting: *Deleted

## 2011-08-05 DIAGNOSIS — I10 Essential (primary) hypertension: Secondary | ICD-10-CM

## 2011-08-05 MED ORDER — HYDROCHLOROTHIAZIDE 50 MG PO TABS: 50.0000 mg | ORAL_TABLET | Freq: Every day | ORAL | Status: DC

## 2011-08-06 ENCOUNTER — Other Ambulatory Visit: Payer: Self-pay | Admitting: *Deleted

## 2011-08-06 DIAGNOSIS — I10 Essential (primary) hypertension: Secondary | ICD-10-CM

## 2011-08-06 MED ORDER — HYDROCHLOROTHIAZIDE 50 MG PO TABS: 50.0000 mg | ORAL_TABLET | Freq: Every day | ORAL | Status: DC

## 2011-08-06 NOTE — Telephone Encounter (Signed)
Pt needed to change upcoming appt due to starting a new job tomorrow.  Rescheduled to July 30th w/ Dr. Orvan Falconer.

## 2011-08-13 ENCOUNTER — Ambulatory Visit: Payer: Self-pay | Admitting: Internal Medicine

## 2011-09-05 ENCOUNTER — Telehealth: Payer: Self-pay

## 2011-09-05 NOTE — Telephone Encounter (Signed)
Tried to call patient about enrolling in RW/ADAP - both phone #'s in [employer] disconnected - wasn't sure if she had medicaid, so put her in to see me after appt with DR Orvan Falconer on 7/29.

## 2011-09-09 ENCOUNTER — Ambulatory Visit: Payer: Self-pay | Admitting: Internal Medicine

## 2011-09-09 ENCOUNTER — Ambulatory Visit: Payer: Self-pay

## 2011-09-09 ENCOUNTER — Telehealth: Payer: Self-pay | Admitting: *Deleted

## 2011-09-09 NOTE — Telephone Encounter (Signed)
Home phone disconnected.

## 2011-09-10 ENCOUNTER — Ambulatory Visit: Payer: Self-pay | Admitting: Internal Medicine

## 2011-12-25 ENCOUNTER — Other Ambulatory Visit: Payer: Self-pay | Admitting: Internal Medicine

## 2011-12-25 ENCOUNTER — Telehealth: Payer: Self-pay | Admitting: *Deleted

## 2011-12-25 DIAGNOSIS — B20 Human immunodeficiency virus [HIV] disease: Secondary | ICD-10-CM

## 2011-12-25 NOTE — Telephone Encounter (Signed)
No Show multiple No Show and Cancelled appts, refer to [facility]

## 2012-10-19 ENCOUNTER — Other Ambulatory Visit: Payer: Self-pay

## 2012-10-28 ENCOUNTER — Other Ambulatory Visit: Payer: Self-pay

## 2013-05-03 DIAGNOSIS — L819 Disorder of pigmentation, unspecified: Secondary | ICD-10-CM | POA: Insufficient documentation

## 2013-05-03 DIAGNOSIS — J209 Acute bronchitis, unspecified: Secondary | ICD-10-CM | POA: Insufficient documentation

## 2013-05-03 DIAGNOSIS — D696 Thrombocytopenia, unspecified: Secondary | ICD-10-CM | POA: Insufficient documentation

## 2013-05-03 DIAGNOSIS — F17200 Nicotine dependence, unspecified, uncomplicated: Secondary | ICD-10-CM | POA: Insufficient documentation

## 2013-05-03 DIAGNOSIS — Z79899 Other long term (current) drug therapy: Secondary | ICD-10-CM | POA: Insufficient documentation

## 2013-05-03 DIAGNOSIS — I10 Essential (primary) hypertension: Secondary | ICD-10-CM | POA: Insufficient documentation

## 2013-05-04 ENCOUNTER — Encounter (HOSPITAL_COMMUNITY): Payer: Self-pay | Admitting: Emergency Medicine

## 2013-05-04 ENCOUNTER — Emergency Department (HOSPITAL_COMMUNITY): Payer: Medicaid Other

## 2013-05-04 ENCOUNTER — Emergency Department (HOSPITAL_COMMUNITY)
Admission: EM | Admit: 2013-05-04 | Discharge: 2013-05-04 | Disposition: A | Discharge status: Home / Self Care | Payer: Medicaid Other | Attending: Emergency Medicine | Admitting: Emergency Medicine

## 2013-05-04 DIAGNOSIS — D696 Thrombocytopenia, unspecified: Secondary | ICD-10-CM

## 2013-05-04 DIAGNOSIS — J40 Bronchitis, not specified as acute or chronic: Secondary | ICD-10-CM

## 2013-05-04 HISTORY — DX: Essential (primary) hypertension: I10

## 2013-05-04 LAB — CBC
HEMATOCRIT: 36.9 % (ref 36.0–46.0)
HEMOGLOBIN: 13 g/dL (ref 12.0–15.0)
MCH: 27.5 pg (ref 26.0–34.0)
MCHC: 35.2 g/dL (ref 30.0–36.0)
MCV: 78.2 fL (ref 78.0–100.0)
Platelets: 43 10*3/uL — ABNORMAL LOW (ref 150–400)
RBC: 4.72 MIL/uL (ref 3.87–5.11)
RDW: 14.5 % (ref 11.5–15.5)
WBC: 8.3 10*3/uL (ref 4.0–10.5)

## 2013-05-04 LAB — BASIC METABOLIC PANEL
BUN: 9 mg/dL (ref 6–23)
CHLORIDE: 100 meq/L (ref 96–112)
CO2: 21 mEq/L (ref 19–32)
Calcium: 9.3 mg/dL (ref 8.4–10.5)
Creatinine, Ser: 0.8 mg/dL (ref 0.50–1.10)
GFR calc non Af Amer: >90 mL/min (ref >90)
GLUCOSE: 93 mg/dL (ref 70–99)
POTASSIUM: 3.5 meq/L — AB (ref 3.7–5.3)
Sodium: 135 mEq/L — ABNORMAL LOW (ref 137–147)

## 2013-05-04 MED ORDER — NICOTINE POLACRILEX 4 MG MT GUM: 4.0000 mg | CHEWING_GUM | OROMUCOSAL | Status: AC | PRN

## 2013-05-04 MED ORDER — PREDNISONE 10 MG PO TABS
20.0000 mg | ORAL_TABLET | Freq: Every day | ORAL | Status: DC
Start: 2013-05-04 — End: 2014-01-29

## 2013-05-04 MED ORDER — GUAIFENESIN ER 600 MG PO TB12: 600.0000 mg | ORAL_TABLET | Freq: Two times a day (BID) | ORAL | Status: DC

## 2013-05-04 MED ORDER — ALBUTEROL SULFATE HFA 108 (90 BASE) MCG/ACT IN AERS: 2.0000 | INHALATION_SPRAY | RESPIRATORY_TRACT | Status: DC | PRN

## 2013-05-04 MED ORDER — ALBUTEROL SULFATE (2.5 MG/3ML) 0.083% IN NEBU
5.0000 mg | INHALATION_SOLUTION | Freq: Once | RESPIRATORY_TRACT | Status: AC
  Administered 2013-05-04: 5 mg via RESPIRATORY_TRACT
  Filled 2013-05-04: qty 6

## 2013-05-04 MED ORDER — PREDNISONE 20 MG PO TABS
60.0000 mg | ORAL_TABLET | Freq: Once | ORAL | Status: AC
  Administered 2013-05-04: 60 mg via ORAL
  Filled 2013-05-04: qty 3

## 2013-05-04 MED ORDER — CEPHALEXIN 500 MG PO CAPS: 500.0000 mg | ORAL_CAPSULE | Freq: Four times a day (QID) | ORAL | Status: DC

## 2013-05-04 MED ORDER — NICOTINE 21 MG/24HR TD PT24
21.0000 mg | MEDICATED_PATCH | Freq: Every day | TRANSDERMAL | Status: AC
Start: 2013-05-04 — End: ?

## 2013-05-04 NOTE — ED Notes (Signed)
Pt states she has a hx of high blood pressure and has not been on her medication for about a year

## 2013-05-04 NOTE — ED Provider Notes (Signed)
CSN: [id number]     Arrival date & time 05/03/13  2259 History   First MD Initiated Contact with Patient 05/04/13 [phone]     Chief Complaint  Patient presents with  . Shortness of Breath  . Cough   HPI  History provided by the patient and significant other. Patient is a 38 year old female with history of hypertension and is a current smoker who presents with complaints of cough congestion and increased fatigue. Symptoms first began over the weekend on Saturday and have been progressively worsening. She states her husband first had similar symptoms last week and now she has symptoms. Reports slightly productive cough of yellow sputum with occasional shortness of breath feeling. Denies any significant chest pain. No hemoptysis. Denies any fevers. Does report some general body aches and discomfort. Patient has been using over-the-counter Robitussin without significant improvement.  Patient also has secondary complaints of some off-and-on chronic problems of drainage and swelling around her neck. 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 Symptoms are slightly improved currently. She has not used any treatments to the area. She does admit to some occasional scratching of the skin to the area due to itching.    Past Medical History  Diagnosis Date  . Hypertension    History reviewed. No pertinent past surgical history. Family History  Problem Relation Age of Onset  . Hypertension Other    History  Substance Use Topics  . Smoking status: Current Every Day Smoker    Types: Cigarettes  . Smokeless tobacco: Not on file  . Alcohol Use: No   OB History   Grav Para Term Preterm Abortions TAB SAB Ect Mult Living                 Review of Systems  Constitutional: Positive for chills. Negative for fever.  HENT: Positive for congestion and rhinorrhea.   Respiratory: Positive for cough and  shortness of breath.   Gastrointestinal: Negative for vomiting and diarrhea.  All other systems reviewed and are negative.      Allergies  Review of patient's allergies indicates no known allergies.  Home Medications   Current Outpatient Rx  Name  Route  Sig  Dispense  Refill  . albuterol (PROVENTIL HFA;VENTOLIN HFA) 108 (90 BASE) MCG/ACT inhaler   Inhalation   Inhale 1 puff into the lungs every 6 (six) hours as needed for wheezing or shortness of breath.         . guaiFENesin (MUCINEX) 600 MG 12 hr tablet   Oral   Take 600 mg by mouth 2 (two) times daily.         Marland Kitchen guaifenesin (ROBITUSSIN) 100 MG/5ML syrup   Oral   Take 200 mg by mouth 3 (three) times daily as needed for cough.         . hydrochlorothiazide (HYDRODIURIL) 50 MG tablet   Oral   Take 1 tablet (50 mg total) by mouth daily.   30 tablet   1    BP 175/122  Pulse 96  Temp(Src) 98.7 F (37.1 C) (Oral)  Resp 20  Ht 5\' 2"  (1.575 m)  Wt 216 lb (97.977 kg)  BMI 39.50 kg/m2  SpO2 100%  LMP 04/30/2013 Physical Exam  Nursing note and vitals reviewed. Constitutional: She is oriented to person, place, and time. She appears well-developed and well-nourished. No distress.  HENT:  Head: Normocephalic.  Right Ear: Tympanic membrane normal.  Left Ear: Tympanic membrane normal.  Mouth/Throat: Oropharynx is clear and moist.  Neck: Normal range of motion. Neck supple.  No meningeal signs  Cardiovascular: Normal rate and regular rhythm.   Pulmonary/Chest: Effort normal. No stridor. No respiratory distress. She has wheezes. She has no rales.  Abdominal: Soft. There is no tenderness. There is no rebound.  Musculoskeletal: Normal range of motion.  Lymphadenopathy:    She has cervical adenopathy.  Neurological: She is alert and oriented to person, place, and time.  Skin: Skin is warm and dry. No rash noted.  Large hyperpigmented area to the base of the right neck and back and shoulder area. Within the area there  are multiple pedunculated skin tag lesions. There is an area to the superior most part with some evidence of dry blood or drainage. No active bleeding or drainage. There does appear to be slightly reactive posterior cervical chain lymph adenopathy.  Psychiatric: She has a normal mood and affect. Her behavior is normal.    ED Course  Procedures   DIAGNOSTIC STUDIES: Oxygen Saturation is 100% on room air.    COORDINATION OF CARE:  Nursing notes reviewed. Vital signs reviewed. Initial pt interview and examination performed.   2:13 AM-patient seen and evaluated. The patient's resting appears well in no acute distress. Does not appear severely ill or toxic. Normal respirations O2 sats. Sniffing wheezing on exam consistent with bronchitis. X-rays thousands and pneumonia. Labs unremarkable. Discuss treatment for her bronchitis as well as her skin irritation and drainage on the neck. She agrees with plan.   Patient also with thrombocytopenia much worse in any previous lab tests. I discussed the patient with attending physician. At this time she has no signs of easy bruising, bleeding or petechiae. Patient strongly encouraged to follow up with her primary care provider. She was given strict return precautions of increased bleeding bruising or petechiae. She expressed her understanding.   Treatment plan initiated: Medications  albuterol (PROVENTIL HFA;VENTOLIN HFA) 108 (90 BASE) MCG/ACT inhaler 2 puff (not administered)  predniSONE (DELTASONE) tablet 60 mg (not administered)  albuterol (PROVENTIL) (2.5 MG/3ML) 0.083% nebulizer solution 5 mg (5 mg Nebulization Given 05/04/13 0206)   Results for orders placed during the hospital encounter of 05/04/13  CBC      Result Value Ref Range   WBC 8.3  4.0 - 10.5 K/uL   RBC 4.72  3.87 - 5.11 MIL/uL   Hemoglobin 13.0  12.0 - 15.0 g/dL   HCT 16.1  09.6 - 04.5 %   MCV 78.2  78.0 - 100.0 fL   MCH 27.5  26.0 - 34.0 pg   MCHC 35.2  30.0 - 36.0 g/dL   RDW 40.9   81.1 - 91.4 %   Platelets 43 (*) 150 - 400 K/uL  BASIC METABOLIC PANEL      Result Value Ref Range   Sodium 135 (*) 137 - 147 mEq/L   Potassium 3.5 (*) 3.7 - 5.3 mEq/L   Chloride 100  96 - 112 mEq/L   CO2 21  19 - 32 mEq/L   Glucose, Bld 93  70 - 99 mg/dL   BUN 9  6 - 23 mg/dL   Creatinine, Ser 7.82  0.50 - 1.10 mg/dL   Calcium 9.3  8.4 - 95.6 mg/dL   GFR calc non Af Amer >90  >90 mL/min   GFR calc Af Amer >90  >90 mL/min      Imaging Review Dg Chest 2 View (if Patient Has Fever And/or  Copd)  05/04/2013   CLINICAL DATA:  Cough and shortness of breath.  EXAM: CHEST  2 VIEW  COMPARISON:  Chest x-ray [facility] 07/14/2003.  FINDINGS: Lung volumes are normal. No consolidative airspace disease. No pleural effusions. No pneumothorax. No pulmonary nodule or mass noted. Pulmonary vasculature and the cardiomediastinal silhouette are within normal limits.  IMPRESSION: 1.  No radiographic evidence of acute cardiopulmonary disease.   Electronically Signed   By: Trudie Reedaniel  Entrikin M.D.   On: 05/04/2013 01:56     MDM   Final diagnoses:  Bronchitis  Thrombocytopenia        Angus Sellereter S Alis Sawchuk, PA-C 05/04/13 [phone]

## 2013-05-04 NOTE — ED Notes (Signed)
Pt states she has not been feeling good since Saturday but it has progressively gotten worse  Pt states she feels short of breath, cough, body aches, chills, congestion  Pt states also she has a bump that keeps coming up on the right side of her neck  Pt states it will come up and then drain then it goes away and a while later it comes back and the process repeats itself  Pt states this has been going on for about 10 to 11 months

## 2013-05-04 NOTE — ED Notes (Addendum)
Patient states she has not had blood pressure medication in over a year because she had no insurance, but now has medicaid. She states that this bump on her right neck comes and goes and that she has to sleep with a towel to her neck at night due to drainage. The drainage is sometimes clear, bloody, or white. She denies odor.

## 2013-05-04 NOTE — Discharge Instructions (Signed)
You were seen and evaluated for your cough and congestion symptoms. At this time your providers feel your symptoms are caused from a bronchitis infection. Use the inhaler by taking 2 puffs 4 hours as needed for cough and wheezing. You have also been given medications to help with your symptoms as well as possible infection of the skin. Your lab testing today also showed concerns of a low platelet count. It is important that you followup with a primary care provider to have a retest of your blood later this week. Please take these and followup with a primary care provider. Return to emergency room if you have any easy bruising or bleeding symptoms.    Bronchitis Bronchitis is inflammation of the airways that extend from the windpipe into the lungs (bronchi). The inflammation often causes mucus to develop, which leads to a cough. If the inflammation becomes severe, it may cause shortness of breath. CAUSES  Bronchitis may be caused by:   Viral infections.   Bacteria.   Cigarette smoke.   Allergens, pollutants, and other irritants.  SIGNS AND SYMPTOMS  The most common symptom of bronchitis is a frequent cough that produces mucus. Other symptoms include:  Fever.   Body aches.   Chest congestion.   Chills.   Shortness of breath.   Sore throat.  DIAGNOSIS  Bronchitis is usually diagnosed through a medical history and physical exam. Tests, such as chest X-rays, are sometimes done to rule out other conditions.  TREATMENT  You may need to avoid contact with whatever caused the problem (smoking, for example). Medicines are sometimes needed. These may include:  Antibiotics. These may be prescribed if the condition is caused by bacteria.  Cough suppressants. These may be prescribed for relief of cough symptoms.   Inhaled medicines. These may be prescribed to help open your airways and make it easier for you to breathe.   Steroid medicines. These may be prescribed for those  with recurrent (chronic) bronchitis. HOME CARE INSTRUCTIONS  Get plenty of rest.   Drink enough fluids to keep your urine clear or pale yellow (unless you have a medical condition that requires fluid restriction). Increasing fluids may help thin your secretions and will prevent dehydration.   Only take over-the-counter or prescription medicines as directed by your health care provider.  Only take antibiotics as directed. Make sure you finish them even if you start to feel better.  Avoid secondhand smoke, irritating chemicals, and strong fumes. These will make bronchitis worse. If you are a smoker, quit smoking. Consider using nicotine gum or skin patches to help control withdrawal symptoms. Quitting smoking will help your lungs heal faster.   Put a cool-mist humidifier in your bedroom at night to moisten the air. This may help loosen mucus. Change the water in the humidifier daily. You can also run the hot water in your shower and sit in the bathroom with the door closed for 5 10 minutes.   Follow up with your health care provider as directed.   Wash your hands frequently to avoid catching bronchitis again or spreading an infection to others.  SEEK MEDICAL CARE IF: Your symptoms do not improve after 1 week of treatment.  SEEK IMMEDIATE MEDICAL CARE IF:  Your fever increases.  You have chills.   You have chest pain.   You have worsening shortness of breath.   You have bloody sputum.  You faint.  You have lightheadedness.  You have a severe headache.   You vomit repeatedly. MAKE SURE  YOU:   Understand these instructions.  Will watch your condition.  Will get help right away if you are not doing well or get worse. Document Released: 01/28/2005 Document Revised: 11/18/2012 Document Reviewed: 09/22/2012 [facility] Patient Information 2014 [place], [place].    Thrombocytopenia Thrombocytopenia is a condition in which there is an abnormally small number of  platelets in your blood. Platelets are also called thrombocytes. Platelets are needed for blood clotting. CAUSES Thrombocytopenia is caused by:   Decreased production of platelets. This can be caused by:  Aplastic anemia in which your bone marrow quits making blood cells.  Cancer in the bone marrow.  Use of certain medicines, including chemotherapy.  Infection in the bone marrow.  Heavy alcohol consumption.  Increased destruction of platelets. This can be caused by:  Certain immune diseases.  Use of certain drugs.  Certain blood clotting disorders.  Certain inherited disorders.  Certain bleeding disorders.  Pregnancy.  Having an enlarged spleen (hypersplenism). In hypersplenism, the spleen gathers up platelets from circulation. This means the platelets are not available to help with blood clotting. The spleen can enlarge due to cirrhosis or other conditions. SYMPTOMS  The symptoms of thrombocytopenia are side effects of poor blood clotting. Some of these are:  Abnormal bleeding.  Nosebleeds.  Heavy menstrual periods.  Blood in the urine or stools.  Purpura. This is a purplish discoloration in the skin produced by small bleeding vessels near the surface of the skin.  Bruising.  A rash that may be petechial. This looks like pinpoint, purplish-red spots on the skin and mucous membranes. It is caused by bleeding from small blood vessels (capillaries). DIAGNOSIS  Your caregiver will make this diagnosis based on your exam and blood tests. Sometimes, a bone marrow study is done to look for the original cells (megakaryocytes) that make platelets. TREATMENT  Treatment depends on the cause of the condition.  Medicines may be given to help protect your platelets from being destroyed.  In some cases, a replacement (transfusion) of platelets may be required to stop or prevent bleeding.  Sometimes, the spleen must be surgically removed. HOME CARE INSTRUCTIONS   Check the  skin and linings inside your mouth for bruising or bleeding as directed by your caregiver.  Check your sputum, urine, and stool for blood as directed by your caregiver.  Do not return to any activities that could cause bumps or bruises until your caregiver says it is okay.  Take extra care not to cut yourself when shaving or when using scissors, needles, knives, and other tools.  Take extra care not to burn yourself when ironing or cooking.  Ask your caregiver if it is okay for you to drink alcohol.  Only take over-the-counter or prescription medicines as directed by your caregiver.  Notify all your caregivers, including dentists and eye doctors, about your condition. SEEK IMMEDIATE MEDICAL CARE IF:   You develop active bleeding from anywhere in your body.  You develop unexplained bruising or bleeding.  You have blood in your sputum, urine, or stool. MAKE SURE YOU:  Understand these instructions.  Will watch your condition.  Will get help right away if you are not doing well or get worse. Document Released: 01/28/2005 Document Revised: 04/22/2011 Document Reviewed: 11/30/2010 [facility] Patient Information 2014 [place], [place].

## 2013-05-05 NOTE — ED Provider Notes (Signed)
Medical screening examination/treatment/procedure(s) were performed by non-physician practitioner and as supervising physician I was immediately available for consultation/collaboration.   EKG Interpretation None        Julie Manly, MD 05/05/13 0618 

## 2013-05-20 ENCOUNTER — Other Ambulatory Visit: Payer: Self-pay | Admitting: *Deleted

## 2013-05-20 DIAGNOSIS — Z79899 Other long term (current) drug therapy: Secondary | ICD-10-CM

## 2013-05-20 DIAGNOSIS — Z113 Encounter for screening for infections with a predominantly sexual mode of transmission: Secondary | ICD-10-CM

## 2013-05-20 DIAGNOSIS — B20 Human immunodeficiency virus [HIV] disease: Secondary | ICD-10-CM

## 2013-05-24 ENCOUNTER — Other Ambulatory Visit: Payer: Medicaid Other

## 2013-05-25 ENCOUNTER — Telehealth: Payer: Self-pay

## 2013-05-25 NOTE — Telephone Encounter (Signed)
Patient calling for blood pressure medications.  She was seen in ED and told she needed to follow up with Dr. Orvan Falconerampbell. She has not been in the office for at least a year.  She will need office visit to follow up for blood pressure and HIV. She is scheduled for labs and office visit.   She was without insurance and thought she did not qualify for services. Since return appointment is at the end of the month I suggested she go to urgent care for blood pressure evaluation.    Laurell Josephsammy K Fabiano Ginley, RN

## 2013-06-07 ENCOUNTER — Ambulatory Visit: Payer: Medicaid Other | Admitting: Internal Medicine

## 2013-06-08 ENCOUNTER — Encounter: Payer: Self-pay | Admitting: Internal Medicine

## 2013-06-08 ENCOUNTER — Ambulatory Visit (INDEPENDENT_AMBULATORY_CARE_PROVIDER_SITE_OTHER): Payer: Medicaid Other | Admitting: Internal Medicine

## 2013-06-08 VITALS — BP 194/146 | HR 112 | Temp 98.4°F | Ht 62.0 in | Wt 219.8 lb

## 2013-06-08 DIAGNOSIS — B20 Human immunodeficiency virus [HIV] disease: Secondary | ICD-10-CM

## 2013-06-08 DIAGNOSIS — I10 Essential (primary) hypertension: Secondary | ICD-10-CM | POA: Diagnosis not present

## 2013-06-08 DIAGNOSIS — Z79899 Other long term (current) drug therapy: Secondary | ICD-10-CM

## 2013-06-08 DIAGNOSIS — Z113 Encounter for screening for infections with a predominantly sexual mode of transmission: Secondary | ICD-10-CM | POA: Diagnosis not present

## 2013-06-08 LAB — COMPREHENSIVE METABOLIC PANEL
ALT: 17 U/L (ref 0–35)
AST: 21 U/L (ref 0–37)
Albumin: 3.8 g/dL (ref 3.5–5.2)
Alkaline Phosphatase: 103 U/L (ref 39–117)
BUN: 9 mg/dL (ref 6–23)
CALCIUM: 9.6 mg/dL (ref 8.4–10.5)
CHLORIDE: 104 meq/L (ref 96–112)
CO2: 26 mEq/L (ref 19–32)
CREATININE: 0.91 mg/dL (ref 0.50–1.10)
Glucose, Bld: 89 mg/dL (ref 70–99)
POTASSIUM: 3.9 meq/L (ref 3.5–5.3)
SODIUM: 135 meq/L (ref 135–145)
TOTAL PROTEIN: 7.9 g/dL (ref 6.0–8.3)
Total Bilirubin: 0.3 mg/dL (ref 0.2–1.2)

## 2013-06-08 LAB — CBC
HCT: 37.6 % (ref 36.0–46.0)
Hemoglobin: 12.4 g/dL (ref 12.0–15.0)
MCH: 26.2 pg (ref 26.0–34.0)
MCHC: 33 g/dL (ref 30.0–36.0)
MCV: 79.3 fL (ref 78.0–100.0)
PLATELETS: 86 10*3/uL — AB (ref 150–400)
RBC: 4.74 MIL/uL (ref 3.87–5.11)
RDW: 15.8 % — ABNORMAL HIGH (ref 11.5–15.5)
WBC: 4.7 10*3/uL (ref 4.0–10.5)

## 2013-06-08 LAB — LIPID PANEL
Cholesterol: 118 mg/dL (ref 0–200)
HDL: 37 mg/dL — AB (ref >39)
LDL CALC: 64 mg/dL (ref 0–99)
Total CHOL/HDL Ratio: 3.2 Ratio
Triglycerides: 83 mg/dL (ref <150)
VLDL: 17 mg/dL (ref 0–40)

## 2013-06-08 MED ORDER — HYDROCHLOROTHIAZIDE 50 MG PO TABS: 50.0000 mg | ORAL_TABLET | Freq: Every day | ORAL | Status: DC

## 2013-06-08 NOTE — Progress Notes (Signed)
Patient ID: Melissa Sloan, female   DOB: Jul 17, 1975, 38 y.o.   MRN: [id number] @LOGODEPT         Patient Active Problem List   Diagnosis Date Noted  . 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 HYPERTENSION 12/12/2005  . OBESITY 11/19/2005  . THROMBOCYTOPENIA 11/19/2005  . CIGARETTE SMOKER 11/19/2005  . HIV DISEASE 02/11/1994    Patient's Medications  New Prescriptions   No medications on file  Previous Medications   ALBUTEROL (PROVENTIL HFA;VENTOLIN HFA) 108 (90 BASE) MCG/ACT INHALER    Inhale 1 puff into the lungs every 6 (six) hours as needed for wheezing or shortness of breath.   CEPHALEXIN (KEFLEX) 500 MG CAPSULE    Take 1 capsule (500 mg total) by mouth 4 (four) times daily.   GUAIFENESIN (MUCINEX) 600 MG 12 HR TABLET    Take 600 mg by mouth 2 (two) times daily.   GUAIFENESIN (MUCINEX) 600 MG 12 HR TABLET    Take 1 tablet (600 mg total) by mouth 2 (two) times daily.   GUAIFENESIN (ROBITUSSIN) 100 MG/5ML SYRUP    Take 200 mg by mouth 3 (three) times daily as needed for cough.   NICOTINE (NICODERM CQ) 21 MG/24HR PATCH    Place 1 patch (21 mg total) onto the skin daily.   NICOTINE POLACRILEX (NICORETTE) 4 MG GUM    Take 1 each (4 mg total) by mouth as needed for smoking cessation.   PREDNISONE (DELTASONE) 10 MG TABLET    Take 2 tablets (20 mg total) by mouth daily.  Modified Medications   Modified Medication Previous Medication   HYDROCHLOROTHIAZIDE (HYDRODIURIL) 50 MG TABLET hydrochlorothiazide (HYDRODIURIL) 50 MG tablet      Take 1 tablet (50 mg total) by mouth daily.    Take 1 tablet (50 mg total) by mouth daily.  Discontinued Medications   No medications on file    Subjective: Melissa Sloan is in for her first visit since 2011. She recently developed some bronchitis and was seen in the emergency department. She has been off of her hydrochlorothiazide and her blood pressure was quite elevated there. I suggested she come back to see me. Her bronchitis is now resolved. She states that  she smokes about 4-5 cigarettes daily and does have a desire to try to quit. She is instructed in getting back on her blood pressure medication. She says that she is willing to consider her HIV treatment if she him "has to".  Review of Systems: A comprehensive review of systems was negative.  Past Medical History  Diagnosis Date  . Hypertension     History  Substance Use Topics  . Smoking status: Current Every Day Smoker -- 0.40 packs/day    Types: Cigarettes  . Smokeless tobacco: Not on file     Comment: trying to slow down  . Alcohol Use: No     Comment: weekends    Family History  Problem Relation Age of Onset  . Hypertension Other     No Known Allergies  Objective: Temp: 98.4 F (36.9 C) (04/28 1046) Temp src: Oral (04/28 1046) BP: 194/146 mmHg (04/28 1046) Pulse Rate: 112 (04/28 1046) Body mass index is 40.18 kg/(m^2).  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 unchanged Lungs: Clear Cor: Regular S1 and S2 with no murmurs Abdomen: Obese, soft nontender Joints and extremities: Normal Neuro: Alert with normal speech and conversation Mood and affect: Normal and appropriate  Lab Results Lab Results  Component Value Date   WBC 8.3 05/04/2013   HGB 13.0 05/04/2013   HCT 36.9 05/04/2013   MCV 78.2 05/04/2013   PLT 43* 05/04/2013    Lab Results  Component Value Date   CREATININE 0.80 05/04/2013   BUN 9 05/04/2013   NA 135* 05/04/2013   K 3.5* 05/04/2013   CL 100 05/04/2013   CO2 21 05/04/2013    Lab Results  Component Value Date   ALT 24 06/19/2011   AST 28 06/19/2011   ALKPHOS 91 06/19/2011   BILITOT 0.4 06/19/2011    Lab Results  Component Value Date   CHOL 135 06/19/2011   HDL 37* 06/19/2011   LDLCALC 77 06/19/2011   TRIG 106 06/19/2011   CHOLHDL 3.6 06/19/2011    Lab Results HIV 1 RNA Quant (copies/mL)  Date Value  06/19/2011 4908*  11/15/2009 2510*  05/06/2008 1230*     CD4 T Cell Abs  (cmm)  Date Value  06/19/2011 820   11/15/2009 650   05/06/2008 580      Assessment: I will need to repeat her HIV related blood work today. For the first time ever, she seems to be willing to consider starting antiretroviral therapy. I will see her back in 2-3 weeks to see if she is ready.  Her blood pressure is severely out of control. I will restart hydrochlorothiazide.  Talk to her about the importance of lifestyle modification for weight reduction and to help her stop smoking cigarettes.  Plan: 1. Restart hydrochlorothiazide 2. Check laboratory today 3. Lifestyle modification counseling provided 4. Followup in 2-3 weeks   Cliffton AstersJohn Zeva Leber, MD [facility] [phone] pager   [phone] cell 06/08/2013, 11:23 AM

## 2013-06-09 LAB — HIV-1 RNA QUANT-NO REFLEX-BLD
HIV 1 RNA Quant: 6393 copies/mL — ABNORMAL HIGH (ref <20)
HIV-1 RNA QUANT, LOG: 3.81 {Log} — AB (ref <1.30)

## 2013-06-09 LAB — T-HELPER CELL (CD4) - (RCID CLINIC ONLY)
CD4 % Helper T Cell: 18 % — ABNORMAL LOW (ref 33–55)
CD4 T CELL ABS: 540 /uL (ref 400–2700)

## 2013-06-09 LAB — RPR

## 2013-06-11 ENCOUNTER — Ambulatory Visit: Payer: Medicaid Other

## 2013-06-18 ENCOUNTER — Ambulatory Visit (INDEPENDENT_AMBULATORY_CARE_PROVIDER_SITE_OTHER): Payer: Medicaid Other | Admitting: *Deleted

## 2013-06-18 ENCOUNTER — Other Ambulatory Visit (HOSPITAL_COMMUNITY)
Admission: RE | Admit: 2013-06-18 | Discharge: 2013-06-18 | Disposition: A | Discharge status: Home / Self Care | Payer: Medicaid Other | Source: Ambulatory Visit | Attending: Internal Medicine | Admitting: Internal Medicine

## 2013-06-18 DIAGNOSIS — Z01419 Encounter for gynecological examination (general) (routine) without abnormal findings: Secondary | ICD-10-CM | POA: Insufficient documentation

## 2013-06-18 DIAGNOSIS — Z113 Encounter for screening for infections with a predominantly sexual mode of transmission: Secondary | ICD-10-CM | POA: Insufficient documentation

## 2013-06-18 DIAGNOSIS — Z124 Encounter for screening for malignant neoplasm of cervix: Secondary | ICD-10-CM

## 2013-06-18 NOTE — Progress Notes (Signed)
  Subjective:     Melissa Sloan is a 38 y.o. woman who comes in today for a  pap smear only.  Previous abnormal Pap smears: yes. Contraception: condoms.  Complaints today of "heart burn, taking Rolaids frequently.  Objective:    LMP 06/02/2013 Pelvic Exam:. Pap smear obtained.   Assessment:    Screening pap smear.   Plan:    Follow up in one year or as indicated by Pap results.  Pt given educational materials re: HIV and women, self-esteem, BSE, nutrition and diet management, PAP smears and partner safety.

## 2013-06-18 NOTE — Patient Instructions (Signed)
Your results will be ready in about a week.  I will either call you or mail them to you.  Thank you for coming to the Center for your care.  Happy Mother's Day!  Angelique Blonderenise, RN

## 2013-06-24 ENCOUNTER — Encounter: Payer: Self-pay | Admitting: *Deleted

## 2013-06-30 ENCOUNTER — Ambulatory Visit: Payer: Medicaid Other | Admitting: Internal Medicine

## 2013-06-30 ENCOUNTER — Telehealth: Payer: Self-pay | Admitting: *Deleted

## 2013-06-30 NOTE — Telephone Encounter (Signed)
Unable to contact pt, non-working phone # listed.

## 2013-09-01 ENCOUNTER — Other Ambulatory Visit: Payer: Self-pay | Admitting: Internal Medicine

## 2013-09-01 ENCOUNTER — Other Ambulatory Visit: Payer: Self-pay | Admitting: *Deleted

## 2013-10-06 ENCOUNTER — Ambulatory Visit: Payer: Medicaid Other | Admitting: Internal Medicine

## 2013-10-06 ENCOUNTER — Telehealth: Payer: Self-pay | Admitting: *Deleted

## 2013-10-06 NOTE — Telephone Encounter (Signed)
Attempted to talk with pt about "No Show" appt for today.  Unable to reach her.  Both phone numbers not in service.  Will mail the pt a letter.

## 2013-10-10 ENCOUNTER — Emergency Department (HOSPITAL_COMMUNITY)
Admission: EM | Admit: 2013-10-10 | Discharge: 2013-10-10 | Disposition: A | Discharge status: Home / Self Care | Payer: Medicaid Other | Attending: Emergency Medicine | Admitting: Emergency Medicine

## 2013-10-10 ENCOUNTER — Encounter (HOSPITAL_COMMUNITY): Payer: Self-pay | Admitting: Emergency Medicine

## 2013-10-10 DIAGNOSIS — Z792 Long term (current) use of antibiotics: Secondary | ICD-10-CM | POA: Insufficient documentation

## 2013-10-10 DIAGNOSIS — F17200 Nicotine dependence, unspecified, uncomplicated: Secondary | ICD-10-CM | POA: Diagnosis not present

## 2013-10-10 DIAGNOSIS — R229 Localized swelling, mass and lump, unspecified: Secondary | ICD-10-CM | POA: Insufficient documentation

## 2013-10-10 DIAGNOSIS — R220 Localized swelling, mass and lump, head: Secondary | ICD-10-CM | POA: Diagnosis present

## 2013-10-10 DIAGNOSIS — Z79899 Other long term (current) drug therapy: Secondary | ICD-10-CM | POA: Diagnosis not present

## 2013-10-10 DIAGNOSIS — R221 Localized swelling, mass and lump, neck: Secondary | ICD-10-CM

## 2013-10-10 DIAGNOSIS — I10 Essential (primary) hypertension: Secondary | ICD-10-CM | POA: Diagnosis not present

## 2013-10-10 MED ORDER — NEOMYCIN-POLYMYXIN-HC 3.5-10000-1 OT SUSP: 3.0000 [drp] | Freq: Three times a day (TID) | OTIC | Status: DC

## 2013-10-10 MED ORDER — ONDANSETRON 4 MG PO TBDP
4.0000 mg | ORAL_TABLET | Freq: Once | ORAL | Status: AC
  Administered 2013-10-10: 4 mg via ORAL
  Filled 2013-10-10: qty 1

## 2013-10-10 MED ORDER — HYDROCODONE-ACETAMINOPHEN 5-325 MG PO TABS: 2.0000 | ORAL_TABLET | ORAL | Status: DC | PRN

## 2013-10-10 MED ORDER — HYDROCODONE-ACETAMINOPHEN 5-325 MG PO TABS
1.0000 | ORAL_TABLET | Freq: Once | ORAL | Status: AC
  Administered 2013-10-10: 1 via ORAL
  Filled 2013-10-10: qty 1

## 2013-10-10 MED ORDER — ANTIPYRINE-BENZOCAINE 5.4-1.4 % OT SOLN: 3.0000 [drp] | OTIC | Status: DC | PRN

## 2013-10-10 NOTE — ED Provider Notes (Signed)
CSN: [id number]     Arrival date & time 10/10/13  1328 History  This chart was scribed for non-physician practitioner, Arthor Captain, PA-C,working with Doug Sou, MD, by Karle Plumber, ED Scribe. This patient was seen in room WTR8/WTR8 and the patient's care was started at 1:43 PM.  Chief Complaint  Patient presents with  . Abscess   The history is provided by the patient. No language interpreter was used.   HPI Comments:  Melissa Sloan is a 38 y.o. obese female with h/o HTN who presents to the Emergency Department complaining of a growing, painless mass on the right side of her neck that appeared last week. Pt states the area is below her birthmark. She states she recently had an abscess above the area but manipulated pus out of it and it cleared up last month. She denies recent weight loss or night sweats. Denies fevers, chills, fatigue, night sweats, unexplained weight loss.   Past Medical History  Diagnosis Date  . Hypertension    History reviewed. No pertinent past surgical history. Family History  Problem Relation Age of Onset  . Hypertension Other    History  Substance Use Topics  . Smoking status: Current Every Day Smoker -- 0.40 packs/day    Types: Cigarettes  . Smokeless tobacco: Not on file     Comment: trying to slow down  . Alcohol Use: No     Comment: weekends   OB History   Grav Para Term Preterm Abortions TAB SAB Ect Mult Living                 Review of Systems  Constitutional: Negative for fever, chills, diaphoresis, appetite change, fatigue and unexpected weight change.  Musculoskeletal: Positive for neck pain.  Skin: Negative for color change.  All other systems reviewed and are negative.   Allergies  Review of patient's allergies indicates no known allergies.  Home Medications   Prior to Admission medications   Medication Sig Start Date End Date Taking? Authorizing Provider  albuterol (PROVENTIL HFA;VENTOLIN HFA) 108 (90 BASE)  MCG/ACT inhaler Inhale 1 puff into the lungs every 6 (six) hours as needed for wheezing or shortness of breath.    Historical Provider, MD  cephALEXin (KEFLEX) 500 MG capsule Take 1 capsule (500 mg total) by mouth 4 (four) times daily. 05/04/13   Phill Mutter Dammen, PA-C  guaiFENesin (MUCINEX) 600 MG 12 hr tablet Take 600 mg by mouth 2 (two) times daily.    Historical Provider, MD  guaiFENesin (MUCINEX) 600 MG 12 hr tablet Take 1 tablet (600 mg total) by mouth 2 (two) times daily. 05/04/13   Phill Mutter Dammen, PA-C  guaifenesin (ROBITUSSIN) 100 MG/5ML syrup Take 200 mg by mouth 3 (three) times daily as needed for cough.    Historical Provider, MD  hydrochlorothiazide (HYDRODIURIL) 50 MG tablet take 1 tablet by mouth once daily 09/01/13   Cliffton Asters, MD  nicotine (NICODERM CQ) 21 mg/24hr patch Place 1 patch (21 mg total) onto the skin daily. 05/04/13   Angus Seller, PA-C  nicotine polacrilex (NICORETTE) 4 MG gum Take 1 each (4 mg total) by mouth as needed for smoking cessation. 05/04/13   Phill Mutter Dammen, PA-C  predniSONE (DELTASONE) 10 MG tablet Take 2 tablets (20 mg total) by mouth daily. 05/04/13   Angus Seller, PA-C   Triage Vitals: BP 137/90  Pulse 93  Temp(Src) 99.2 F (37.3 C) (Oral)  Resp 20  SpO2 100%  LMP 08/31/2013 Physical Exam  Nursing note and vitals reviewed. Constitutional: She is oriented to person, place, and time. She appears well-developed and well-nourished.  HENT:  Head: Normocephalic and atraumatic.  Eyes: EOM are normal.  Neck: Normal range of motion.  Cardiovascular: Normal rate.   Pulmonary/Chest: Effort normal.  Musculoskeletal: Normal range of motion.  Neurological: She is alert and oriented to person, place, and time.  Skin: Skin is warm and dry.  Hairy nevus over right trapezius area/supraclavicular area. 3 x 5 cm movable mass. Firm around outside but nontender with central fluctuance.  Psychiatric: She has a normal mood and affect. Her behavior is normal.    ED  Course  Procedures (including critical care time) DIAGNOSTIC STUDIES: Oxygen Saturation is 100% on RA, normal by my interpretation.   COORDINATION OF CARE: 1:47 PM- Will ultrasound area. Pt verbalizes understanding and agrees to plan.  1:58 PM- Will refer to dermatology.  Medications - No data to display  Labs Review Labs Reviewed - No data to display  Imaging Review No results found.   EKG Interpretation None      MDM   Final diagnoses:  Mass of skin    Patient with mass of R supraclavicular region. It is soft and mobile within the skin. I did apply a bedside ultrasound and it appears well demarcated and consistent with a cystic lesion. Discussed the case with my attending physician Dr. Doug Sou.  The patient has medicaid and I have listed several dermatologists for follow up. I have also referred to [facility] for removal. I do not suspect supraclavicular lymph node, abscess.  I personally performed the services described in this documentation, which was scribed in my presence. The recorded information has been reviewed and is accurate.   Arthor Captain, PA-C 10/10/13 [phone]

## 2013-10-10 NOTE — Discharge Instructions (Signed)
Cyst Removal   Your caregiver has removed a cyst. A cyst is a sac containing a semi-solid material. Cysts may occur any place on your body. They may remain small for years or gradually get larger. A sebaceous cyst is an enlarged (dilated) sweat gland filled with old sweat (sebum). Unattended, these may become large (the size of a softball) over several years time. These are often removed for improved appearance (cosmetic) reasons or before they become infected to form an abscess. An abscess is an infected cyst.   HOME CARE INSTRUCTIONS   Keep your bandage clean and dry. You may change your bandage after 24 hours. If your bandage sticks, use warm water to gently loosen it. Pat the area dry with a clean towel before putting on another bandage.   If possible, keep the area where the cyst was removed raised to relieve soreness, swelling, and promote healing.   If you have stitches, keep them clean and dry.   You may clean your stitches gently with a cotton swab dipped in warm soapy water.   Do not soak the area where the cyst was removed or go swimming. You may shower.   Do not overuse the area where your cyst was removed.   Return in 7 days or as directed to have your stitches removed.   Take medicines as instructed by your caregiver.  SEEK IMMEDIATE MEDICAL CARE IF:   An oral temperature above 102° F (38.9° C) develops, not controlled by medication.   Blood continues to soak through the bandage.   You have increasing pain in the area where your cyst was removed.   You have redness, swelling, pus, a bad smell, soreness (inflammation), or red streaks coming away from the stitches. These are signs of infection.  MAKE SURE YOU:   Understand these instructions.   Will watch your condition.   Will get help right away if you are not doing well or get worse.  Document Released: 01/26/2000 Document Revised: 04/22/2011 Document Reviewed: 05/21/2007   Exit[employer]® Patient Information ©2015 [employer]. This information is not  intended to replace advice given to you by your health care provider. Make sure you discuss any questions you have with your health care provider.

## 2013-10-10 NOTE — ED Notes (Addendum)
Pt reports having an abscess on her neck that started last week. Abscess is on her birth mark to the right side of her neck. Pt reports having an abscess last month above the current location, however decreased in size. Pt states the area is itches and is only painful if she stretches, however it is not painful at rest. Pt is A/O x4 and in NAD.

## 2013-10-10 NOTE — ED Provider Notes (Signed)
Medical screening examination/treatment/procedure(s) were performed by non-physician practitioner and as supervising physician I was immediately available for consultation/collaboration.   EKG Interpretation None       Kasem Mozer, MD 10/10/13 1723 

## 2013-10-11 ENCOUNTER — Telehealth: Payer: Self-pay | Admitting: *Deleted

## 2013-10-11 NOTE — Telephone Encounter (Signed)
Patient called stating she was seen at ED yesterday for an abscess on her neck. Was told she needed a referral to a dermatologist or general surgeon. Patient has [employer] and explained to her that the referral needed to come from the MD listed on her insurance card which is [facility]. Patient will call that office to request an appointment. Scheduled patient a lab and follow up appt with Dr. Orvan Falconer in October. Wendall Mola

## 2013-10-28 ENCOUNTER — Other Ambulatory Visit (INDEPENDENT_AMBULATORY_CARE_PROVIDER_SITE_OTHER): Payer: Self-pay

## 2013-10-28 DIAGNOSIS — R221 Localized swelling, mass and lump, neck: Secondary | ICD-10-CM

## 2013-11-01 ENCOUNTER — Ambulatory Visit
Admission: RE | Admit: 2013-11-01 | Discharge: 2013-11-01 | Disposition: A | Discharge status: Home / Self Care | Payer: Medicaid Other | Source: Ambulatory Visit | Attending: General Surgery | Admitting: General Surgery

## 2013-11-01 DIAGNOSIS — R221 Localized swelling, mass and lump, neck: Secondary | ICD-10-CM

## 2013-11-01 MED ORDER — IOHEXOL 300 MG/ML  SOLN
75.0000 mL | Freq: Once | INTRAMUSCULAR | Status: AC | PRN
  Administered 2013-11-01: 75 mL via INTRAVENOUS

## 2013-11-15 ENCOUNTER — Other Ambulatory Visit: Payer: Medicaid Other

## 2013-11-22 ENCOUNTER — Other Ambulatory Visit: Payer: Self-pay | Admitting: *Deleted

## 2013-11-22 MED ORDER — HYDROCHLOROTHIAZIDE 50 MG PO TABS: ORAL_TABLET | ORAL | Status: DC

## 2013-11-30 ENCOUNTER — Ambulatory Visit: Payer: Medicaid Other | Admitting: Internal Medicine

## 2013-12-06 ENCOUNTER — Ambulatory Visit: Payer: Medicaid Other | Admitting: Internal Medicine

## 2014-01-29 ENCOUNTER — Emergency Department (HOSPITAL_COMMUNITY): Payer: Medicaid Other

## 2014-01-29 ENCOUNTER — Emergency Department (HOSPITAL_COMMUNITY)
Admission: EM | Admit: 2014-01-29 | Discharge: 2014-01-29 | Disposition: A | Discharge status: Home / Self Care | Payer: Medicaid Other | Attending: Emergency Medicine | Admitting: Emergency Medicine

## 2014-01-29 ENCOUNTER — Encounter (HOSPITAL_COMMUNITY): Payer: Self-pay | Admitting: Emergency Medicine

## 2014-01-29 DIAGNOSIS — Z79899 Other long term (current) drug therapy: Secondary | ICD-10-CM | POA: Insufficient documentation

## 2014-01-29 DIAGNOSIS — I10 Essential (primary) hypertension: Secondary | ICD-10-CM | POA: Diagnosis not present

## 2014-01-29 DIAGNOSIS — R0602 Shortness of breath: Secondary | ICD-10-CM | POA: Diagnosis present

## 2014-01-29 DIAGNOSIS — Z720 Tobacco use: Secondary | ICD-10-CM | POA: Diagnosis not present

## 2014-01-29 DIAGNOSIS — J45901 Unspecified asthma with (acute) exacerbation: Secondary | ICD-10-CM | POA: Diagnosis not present

## 2014-01-29 DIAGNOSIS — R0600 Dyspnea, unspecified: Secondary | ICD-10-CM

## 2014-01-29 MED ORDER — IPRATROPIUM-ALBUTEROL 0.5-2.5 (3) MG/3ML IN SOLN
3.0000 mL | Freq: Once | RESPIRATORY_TRACT | Status: AC
  Administered 2014-01-29: 3 mL via RESPIRATORY_TRACT
  Filled 2014-01-29: qty 3

## 2014-01-29 MED ORDER — ALBUTEROL SULFATE (2.5 MG/3ML) 0.083% IN NEBU
5.0000 mg | INHALATION_SOLUTION | Freq: Once | RESPIRATORY_TRACT | Status: AC
  Administered 2014-01-29: 5 mg via RESPIRATORY_TRACT
  Filled 2014-01-29: qty 6

## 2014-01-29 MED ORDER — AZITHROMYCIN 250 MG PO TABS: 250.0000 mg | ORAL_TABLET | Freq: Every day | ORAL | Status: DC

## 2014-01-29 MED ORDER — ALBUTEROL SULFATE (2.5 MG/3ML) 0.083% IN NEBU
INHALATION_SOLUTION | RESPIRATORY_TRACT | Status: AC
  Filled 2014-01-29: qty 3

## 2014-01-29 MED ORDER — ALBUTEROL SULFATE HFA 108 (90 BASE) MCG/ACT IN AERS: 1.0000 | INHALATION_SPRAY | Freq: Four times a day (QID) | RESPIRATORY_TRACT | Status: AC | PRN

## 2014-01-29 MED ORDER — PREDNISONE 50 MG PO TABS: 50.0000 mg | ORAL_TABLET | Freq: Every day | ORAL | Status: DC

## 2014-01-29 MED ORDER — PREDNISONE 20 MG PO TABS
60.0000 mg | ORAL_TABLET | Freq: Once | ORAL | Status: AC
  Administered 2014-01-29: 60 mg via ORAL
  Filled 2014-01-29: qty 3

## 2014-01-29 NOTE — ED Notes (Signed)
Pt presents with c/o difficulty breathing, pt states she has had a cold all week, using inhaler and various OTC cold medications without relief. Pt also c/o posterior rib pain

## 2014-01-29 NOTE — ED Notes (Signed)
I have notified [facility] for h.h.n.--they will be here shortly.  Pt. Remains pleasant and in no distress.

## 2014-01-29 NOTE — ED Notes (Signed)
She states she feels "a  Lot better".  She dresses and ambulates without difficulty.

## 2014-01-29 NOTE — ED Notes (Signed)
Pt alert, oriented, and ambulatory upon DC. Pt leaves with DC papers in hand and was instructed to follow up with PCP in 1 week.

## 2014-01-29 NOTE — ED Provider Notes (Addendum)
CSN: [id number]     Arrival date & time 01/29/14  0455 History   First MD Initiated Contact with Patient 01/29/14 0525     Chief Complaint  Patient presents with  . Shortness of Breath     (Consider location/radiation/quality/duration/timing/severity/associated sxs/prior Treatment) HPI Comments: Pt comes in with cc of DIB. Pt has hx of asthma. States that she has been having some dib for the past few days. Overtime, she has been getting worse. There is + cough, yellow/green. Pt has no fevers. Pt has been taking breathing tx, with transient relief.   Patient is a 38 y.o. female presenting with shortness of breath. The history is provided by the patient.  Shortness of Breath Associated symptoms: cough and wheezing   Associated symptoms: no abdominal pain, no chest pain, no headaches, no neck pain and no vomiting     Past Medical History  Diagnosis Date  . Hypertension    History reviewed. No pertinent past surgical history. Family History  Problem Relation Age of Onset  . Hypertension Other    History  Substance Use Topics  . Smoking status: Current Every Day Smoker -- 0.40 packs/day    Types: Cigarettes  . Smokeless tobacco: Not on file     Comment: trying to slow down  . Alcohol Use: No     Comment: weekends   OB History    No data available     Review of Systems  Constitutional: Positive for activity change.  Respiratory: Positive for cough, shortness of breath and wheezing.   Cardiovascular: Negative for chest pain.  Gastrointestinal: Negative for nausea, vomiting and abdominal pain.  Genitourinary: Negative for dysuria.  Musculoskeletal: Negative for neck pain.  Neurological: Negative for headaches.      Allergies  Review of patient's allergies indicates no known allergies.  Home Medications   Prior to Admission medications   Medication Sig Start Date End Date Taking? Authorizing Provider  albuterol (PROVENTIL HFA;VENTOLIN HFA) 108 (90 BASE) MCG/ACT  inhaler Inhale 1 puff into the lungs every 6 (six) hours as needed for wheezing or shortness of breath.   Yes Historical Provider, MD  guaiFENesin (MUCINEX) 600 MG 12 hr tablet Take 600 mg by mouth 2 (two) times daily.   Yes Historical Provider, MD  guaifenesin (ROBITUSSIN) 100 MG/5ML syrup Take 200 mg by mouth 3 (three) times daily as needed for cough.   Yes Historical Provider, MD  hydrochlorothiazide (HYDRODIURIL) 50 MG tablet take 1 tablet by mouth once daily Patient taking differently: Take 50 mg by mouth daily.  11/22/13  Yes Cliffton AstersJohn Campbell, MD  Phenyleph-Doxylamine-DM-APAP (ALKA-SELTZER PLUS COLD & FLU) 10-12.5-20-650 MG PACK Take 1 Package by mouth every 6 (six) hours as needed (cough).   Yes Historical Provider, MD  Phenylephrine-DM-GG-APAP (TYLENOL COLD HEAD CONGESTION) 5-10-200-325 MG TABS Take 2 tablets by mouth every 6 (six) hours as needed. cough   Yes Historical Provider, MD  cephALEXin (KEFLEX) 500 MG capsule Take 1 capsule (500 mg total) by mouth 4 (four) times daily. Patient not taking: Reported on 01/29/2014 05/04/13   Phill MutterPeter S Dammen, PA-C  HYDROcodone-acetaminophen (NORCO) 5-325 MG per tablet Take 2 tablets by mouth every 4 (four) hours as needed. Patient not taking: Reported on 01/29/2014 10/10/13   Arthor CaptainAbigail Harris, PA-C  nicotine (NICODERM CQ) 21 mg/24hr patch Place 1 patch (21 mg total) onto the skin daily. Patient not taking: Reported on 01/29/2014 05/04/13   Phill MutterPeter S Dammen, PA-C  nicotine polacrilex (NICORETTE) 4 MG gum Take 1 each (4  mg total) by mouth as needed for smoking cessation. Patient not taking: Reported on 01/29/2014 05/04/13   Phill MutterPeter S Dammen, PA-C  predniSONE (DELTASONE) 10 MG tablet Take 2 tablets (20 mg total) by mouth daily. Patient not taking: Reported on 01/29/2014 05/04/13   Phill MutterPeter S Dammen, PA-C   BP 130/83 mmHg  Pulse 109  Temp(Src) 98.3 F (36.8 C) (Oral)  Resp 18  Ht 5\' 3"  (1.6 m)  Wt 220 lb (99.791 kg)  BMI 38.98 kg/m2  SpO2 100%  LMP  01/22/2014 Physical Exam  Constitutional: She is oriented to person, place, and time. She appears well-developed and well-nourished.  HENT:  Head: Normocephalic and atraumatic.  Eyes: EOM are normal. Pupils are equal, round, and reactive to light.  Neck: Neck supple.  Cardiovascular: Normal rate, regular rhythm and normal heart sounds.   No murmur heard. Pulmonary/Chest: Effort normal. No respiratory distress. She has wheezes.  Abdominal: Soft. She exhibits no distension. There is no tenderness. There is no rebound and no guarding.  Neurological: She is alert and oriented to person, place, and time.  Skin: Skin is warm and dry.  Nursing note and vitals reviewed.   ED Course  Procedures (including critical care time) Labs Review Labs Reviewed - No data to display  Imaging Review Dg Chest 2 View  01/29/2014   CLINICAL DATA:  Difficulty breathing, acute onset. Posterior rib pain. Current history of smoking. Initial encounter.  EXAM: CHEST  2 VIEW  COMPARISON:  Chest radiograph performed 05/04/2013  FINDINGS: The lungs are well-aerated. Minimal bibasilar opacities likely reflect atelectasis. There is no evidence of pleural effusion or pneumothorax.  The heart is normal in size; the mediastinal contour is within normal limits. No acute osseous abnormalities are seen.  IMPRESSION: Minimal bibasilar opacities likely reflect atelectasis; lungs otherwise clear.   Electronically Signed   By: Roanna RaiderJeffery  Chang M.D.   On: 01/29/2014 06:36     EKG Interpretation None      MDM   Final diagnoses:  Dyspnea  Asthma exacerbation    Pt comes in w/ wheezing. Hx of asthma/copd. She has a new cough, it is yellow, she is a smoker.  Pt given 1 round of breathing tx - she is better, but not completely improved. Will give her 1 more tx, and then i think she soul be able to go home. Return precautions discussed.  The patient was counseled on the dangers of tobacco use, and was advised to quit.   Reviewed strategies to maximize success, including removing cigarettes and smoking materials from environment and substitution of other forms of reinforcement. Discussion 2-3 min.    Derwood KaplanAnkit Oaklan Persons, MD 01/29/14 [id number]  Derwood KaplanAnkit Casaundra Takacs, MD 01/29/14 [id number]

## 2014-01-29 NOTE — Discharge Instructions (Signed)
We saw you in the ER for your asthma/copd/bronchitis related complains. We gave you some breathing treatments in the ER, and seems like your symptoms have improved. Please take albuterol as needed every 4 hours. Please take the medications prescribed. Please refrain from smoking or smoke exposure. Please see a primary care doctor in 1 week. Return to the ER if your symptoms worsen.  Acute Bronchitis Bronchitis is inflammation of the airways that extend from the windpipe into the lungs (bronchi). The inflammation often causes mucus to develop. This leads to a cough, which is the most common symptom of bronchitis.  In acute bronchitis, the condition usually develops suddenly and goes away over time, usually in a couple weeks. Smoking, allergies, and asthma can make bronchitis worse. Repeated episodes of bronchitis may cause further lung problems.  CAUSES Acute bronchitis is most often caused by the same virus that causes a cold. The virus can spread from person to person (contagious) through coughing, sneezing, and touching contaminated objects. SIGNS AND SYMPTOMS   Cough.   Fever.   Coughing up mucus.   Body aches.   Chest congestion.   Chills.   Shortness of breath.   Sore throat.  DIAGNOSIS  Acute bronchitis is usually diagnosed through a physical exam. Your health care provider will also ask you questions about your medical history. Tests, such as chest X-rays, are sometimes done to rule out other conditions.  TREATMENT  Acute bronchitis usually goes away in a couple weeks. Oftentimes, no medical treatment is necessary. Medicines are sometimes given for relief of fever or cough. Antibiotic medicines are usually not needed but may be prescribed in certain situations. In some cases, an inhaler may be recommended to help reduce shortness of breath and control the cough. A cool mist vaporizer may also be used to help thin bronchial secretions and make it easier to clear the  chest.  HOME CARE INSTRUCTIONS  Get plenty of rest.   Drink enough fluids to keep your urine clear or pale yellow (unless you have a medical condition that requires fluid restriction). Increasing fluids may help thin your respiratory secretions (sputum) and reduce chest congestion, and it will prevent dehydration.   Take medicines only as directed by your health care provider.  If you were prescribed an antibiotic medicine, finish it all even if you start to feel better.  Avoid smoking and secondhand smoke. Exposure to cigarette smoke or irritating chemicals will make bronchitis worse. If you are a smoker, consider using nicotine gum or skin patches to help control withdrawal symptoms. Quitting smoking will help your lungs heal faster.   Reduce the chances of another bout of acute bronchitis by washing your hands frequently, avoiding people with cold symptoms, and trying not to touch your hands to your mouth, nose, or eyes.   Keep all follow-up visits as directed by your health care provider.  SEEK MEDICAL CARE IF: Your symptoms do not improve after 1 week of treatment.  SEEK IMMEDIATE MEDICAL CARE IF:  You develop an increased fever or chills.   You have chest pain.   You have severe shortness of breath.  You have bloody sputum.   You develop dehydration.  You faint or repeatedly feel like you are going to pass out.  You develop repeated vomiting.  You develop a severe headache. MAKE SURE YOU:   Understand these instructions.  Will watch your condition.  Will get help right away if you are not doing well or get worse. Document Released: 03/07/2004  Document Revised: 06/14/2013 Document Reviewed: 07/21/2012 [facility] Patient Information 2015 [place], [place]. This information is not intended to replace advice given to you by your health care provider. Make sure you discuss any questions you have with your health care provider. Chronic Obstructive Pulmonary  Disease Chronic obstructive pulmonary disease (COPD) is a common lung condition in which airflow from the lungs is limited. COPD is a general term that can be used to describe many different lung problems that limit airflow, including both chronic bronchitis and emphysema. If you have COPD, your lung function will probably never return to normal, but there are measures you can take to improve lung function and make yourself feel better.  CAUSES   Smoking (common).   Exposure to secondhand smoke.   Genetic problems.  Chronic inflammatory lung diseases or recurrent infections. SYMPTOMS   Shortness of breath, especially with physical activity.   Deep, persistent (chronic) cough with a large amount of thick mucus.   Wheezing.   Rapid breaths (tachypnea).   Gray or bluish discoloration (cyanosis) of the skin, especially in fingers, toes, or lips.   Fatigue.   Weight loss.   Frequent infections or episodes when breathing symptoms become much worse (exacerbations).   Chest tightness. DIAGNOSIS  Your health care provider will take a medical history and perform a physical examination to make the initial diagnosis. Additional tests for COPD may include:   Lung (pulmonary) function tests.  Chest X-ray.  CT scan.  Blood tests. TREATMENT  Treatment available to help you feel better when you have COPD includes:   Inhaler and nebulizer medicines. These help manage the symptoms of COPD and make your breathing more comfortable.  Supplemental oxygen. Supplemental oxygen is only helpful if you have a low oxygen level in your blood.   Exercise and physical activity. These are beneficial for nearly all people with COPD. Some people may also benefit from a pulmonary rehabilitation program. HOME CARE INSTRUCTIONS   Take all medicines (inhaled or pills) as directed by your health care provider.  Avoid over-the-counter medicines or cough syrups that dry up your airway (such as  antihistamines) and slow down the elimination of secretions unless instructed otherwise by your health care provider.   If you are a smoker, the most important thing that you can do is stop smoking. Continuing to smoke will cause further lung damage and breathing trouble. Ask your health care provider for help with quitting smoking. He or she can direct you to community resources or hospitals that provide support.  Avoid exposure to irritants such as smoke, chemicals, and fumes that aggravate your breathing.  Use oxygen therapy and pulmonary rehabilitation if directed by your health care provider. If you require home oxygen therapy, ask your health care provider whether you should purchase a pulse oximeter to measure your oxygen level at home.   Avoid contact with individuals who have a contagious illness.  Avoid extreme temperature and humidity changes.  Eat healthy foods. Eating smaller, more frequent meals and resting before meals may help you maintain your strength.  Stay active, but balance activity with periods of rest. Exercise and physical activity will help you maintain your ability to do things you want to do.  Preventing infection and hospitalization is very important when you have COPD. Make sure to receive all the vaccines your health care provider recommends, especially the pneumococcal and influenza vaccines. Ask your health care provider whether you need a pneumonia vaccine.  Learn and use relaxation techniques to manage stress.  Learn and use controlled breathing techniques as directed by your health care provider. Controlled breathing techniques include:   Pursed lip breathing. Start by breathing in (inhaling) through your nose for 1 second. Then, purse your lips as if you were going to whistle and breathe out (exhale) through the pursed lips for 2 seconds.   Diaphragmatic breathing. Start by putting one hand on your abdomen just above your waist. Inhale slowly through  your nose. The hand on your abdomen should move out. Then purse your lips and exhale slowly. You should be able to feel the hand on your abdomen moving in as you exhale.   Learn and use controlled coughing to clear mucus from your lungs. Controlled coughing is a series of short, progressive coughs. The steps of controlled coughing are:  1. Lean your head slightly forward.  2. Breathe in deeply using diaphragmatic breathing.  3. Try to hold your breath for 3 seconds.  4. Keep your mouth slightly open while coughing twice.  5. Spit any mucus out into a tissue.  6. Rest and repeat the steps once or twice as needed. SEEK MEDICAL CARE IF:   You are coughing up more mucus than usual.   There is a change in the color or thickness of your mucus.   Your breathing is more labored than usual.   Your breathing is faster than usual.  SEEK IMMEDIATE MEDICAL CARE IF:   You have shortness of breath while you are resting.   You have shortness of breath that prevents you from:  Being able to talk.   Performing your usual physical activities.   You have chest pain lasting longer than 5 minutes.   Your skin color is more cyanotic than usual.  You measure low oxygen saturations for longer than 5 minutes with a pulse oximeter. MAKE SURE YOU:   Understand these instructions.  Will watch your condition.  Will get help right away if you are not doing well or get worse. Document Released: 11/07/2004 Document Revised: 06/14/2013 Document Reviewed: 09/24/2012 [facility] Patient Information 2015 [place], [place]. This information is not intended to replace advice given to you by your health care provider. Make sure you discuss any questions you have with your health care provider.

## 2014-04-26 ENCOUNTER — Encounter (HOSPITAL_COMMUNITY): Payer: Self-pay | Admitting: Emergency Medicine

## 2014-04-26 ENCOUNTER — Emergency Department (HOSPITAL_COMMUNITY)
Admission: EM | Admit: 2014-04-26 | Discharge: 2014-04-27 | Disposition: A | Discharge status: Home / Self Care | Payer: Medicaid Other | Attending: Emergency Medicine | Admitting: Emergency Medicine

## 2014-04-26 ENCOUNTER — Emergency Department (HOSPITAL_COMMUNITY): Payer: Medicaid Other

## 2014-04-26 DIAGNOSIS — L03221 Cellulitis of neck: Secondary | ICD-10-CM | POA: Insufficient documentation

## 2014-04-26 DIAGNOSIS — L0211 Cutaneous abscess of neck: Secondary | ICD-10-CM | POA: Diagnosis not present

## 2014-04-26 DIAGNOSIS — I10 Essential (primary) hypertension: Secondary | ICD-10-CM | POA: Insufficient documentation

## 2014-04-26 DIAGNOSIS — Z7952 Long term (current) use of systemic steroids: Secondary | ICD-10-CM | POA: Diagnosis not present

## 2014-04-26 DIAGNOSIS — Z720 Tobacco use: Secondary | ICD-10-CM | POA: Insufficient documentation

## 2014-04-26 DIAGNOSIS — Z792 Long term (current) use of antibiotics: Secondary | ICD-10-CM | POA: Diagnosis not present

## 2014-04-26 DIAGNOSIS — Z79899 Other long term (current) drug therapy: Secondary | ICD-10-CM | POA: Insufficient documentation

## 2014-04-26 DIAGNOSIS — M542 Cervicalgia: Secondary | ICD-10-CM | POA: Diagnosis present

## 2014-04-26 LAB — I-STAT CHEM 8, ED
BUN: 11 mg/dL (ref 6–23)
CALCIUM ION: 1.21 mmol/L (ref 1.12–1.23)
Chloride: 104 mmol/L (ref 96–112)
Creatinine, Ser: 0.8 mg/dL (ref 0.50–1.10)
Glucose, Bld: 103 mg/dL — ABNORMAL HIGH (ref 70–99)
HCT: 41 % (ref 36.0–46.0)
HEMOGLOBIN: 13.9 g/dL (ref 12.0–15.0)
Potassium: 3.7 mmol/L (ref 3.5–5.1)
SODIUM: 139 mmol/L (ref 135–145)
TCO2: 19 mmol/L (ref 0–100)

## 2014-04-26 MED ORDER — OXYCODONE-ACETAMINOPHEN 5-325 MG PO TABS
1.0000 | ORAL_TABLET | Freq: Once | ORAL | Status: AC
  Administered 2014-04-26: 1 via ORAL
  Filled 2014-04-26: qty 1

## 2014-04-26 NOTE — ED Provider Notes (Signed)
CSN: [id number]     Arrival date & time 04/26/14  2032 History  This chart was scribed for Elpidio AnisShari Oreste Majeed, PA-C, working with Glynn OctaveStephen Rancour, MD by Elon SpannerGarrett Cook, ED Scribe. This patient was seen in room WTR8/WTR8 and the patient's care was started at 9:47 PM.   Chief Complaint  Patient presents with  . Neck Pain   The history is provided by the patient. No language interpreter was used.   HPI Comments: Melissa Sloan is a 39 y.o. female who presents to the Emergency Department complaining of a worsening area of pain and swelling on the right side of her neck.  She reports she had a similar complaint 10/2013 at which time she received CT-scan, was diagnosed with an abscess, and was referred to a dermatologist.  The abscess at that time drained independently. She reports that episode resolved but states her current complaint feels similar. No fever or difficulty swallowing.   Past Medical History  Diagnosis Date  . Hypertension    History reviewed. No pertinent past surgical history. Family History  Problem Relation Age of Onset  . Hypertension Other    History  Substance Use Topics  . Smoking status: Current Every Day Smoker -- 0.40 packs/day    Types: Cigarettes  . Smokeless tobacco: Not on file     Comment: trying to slow down  . Alcohol Use: No     Comment: weekends   OB History    No data available     Review of Systems  Constitutional: Negative for fever.  HENT: Negative for sore throat and trouble swallowing.   Gastrointestinal: Negative for nausea.  Musculoskeletal: Positive for neck pain.      Allergies  Review of patient's allergies indicates no known allergies.  Home Medications   Prior to Admission medications   Medication Sig Start Date End Date Taking? Authorizing Provider  albuterol (PROVENTIL HFA;VENTOLIN HFA) 108 (90 BASE) MCG/ACT inhaler Inhale 1 puff into the lungs every 6 (six) hours as needed for wheezing or shortness of breath.    Historical  Provider, MD  albuterol (PROVENTIL HFA;VENTOLIN HFA) 108 (90 BASE) MCG/ACT inhaler Inhale 1-2 puffs into the lungs every 6 (six) hours as needed for wheezing or shortness of breath. 01/29/14   Derwood KaplanAnkit Nanavati, MD  azithromycin (ZITHROMAX) 250 MG tablet Take 1 tablet (250 mg total) by mouth daily. Take first 2 tablets together, then 1 every day until finished. 01/29/14   Derwood KaplanAnkit Nanavati, MD  cephALEXin (KEFLEX) 500 MG capsule Take 1 capsule (500 mg total) by mouth 4 (four) times daily. Patient not taking: Reported on 01/29/2014 05/04/13   Ivonne AndrewPeter Dammen, PA-C  guaiFENesin (MUCINEX) 600 MG 12 hr tablet Take 600 mg by mouth 2 (two) times daily.    Historical Provider, MD  guaifenesin (ROBITUSSIN) 100 MG/5ML syrup Take 200 mg by mouth 3 (three) times daily as needed for cough.    Historical Provider, MD  hydrochlorothiazide (HYDRODIURIL) 50 MG tablet take 1 tablet by mouth once daily Patient taking differently: Take 50 mg by mouth daily.  11/22/13   Cliffton AstersJohn Campbell, MD  HYDROcodone-acetaminophen (NORCO) 5-325 MG per tablet Take 2 tablets by mouth every 4 (four) hours as needed. Patient not taking: Reported on 01/29/2014 10/10/13   Arthor CaptainAbigail Harris, PA-C  nicotine (NICODERM CQ) 21 mg/24hr patch Place 1 patch (21 mg total) onto the skin daily. Patient not taking: Reported on 01/29/2014 05/04/13   Ivonne AndrewPeter Dammen, PA-C  nicotine polacrilex (NICORETTE) 4 MG gum Take 1 each (4  mg total) by mouth as needed for smoking cessation. Patient not taking: Reported on 01/29/2014 05/04/13   Ivonne Andrew, PA-C  Phenyleph-Doxylamine-DM-APAP (ALKA-SELTZER PLUS COLD & FLU) 10-12.5-20-650 MG PACK Take 1 Package by mouth every 6 (six) hours as needed (cough).    Historical Provider, MD  Phenylephrine-DM-GG-APAP (TYLENOL COLD HEAD CONGESTION) 5-10-200-325 MG TABS Take 2 tablets by mouth every 6 (six) hours as needed. cough    Historical Provider, MD  predniSONE (DELTASONE) 50 MG tablet Take 1 tablet (50 mg total) by mouth daily. 01/29/14    Ankit Nanavati, MD   BP 164/97 mmHg  Pulse 83  Temp(Src) 99 F (37.2 C) (Oral)  SpO2 100%  LMP 04/03/2014 Physical Exam  Constitutional: She is oriented to person, place, and time. She appears well-developed and well-nourished. No distress.  HENT:  Head: Normocephalic and atraumatic.  Eyes: Conjunctivae and EOM are normal.  Neck: Neck supple. No tracheal deviation present.  Cardiovascular: Normal rate.   Pulmonary/Chest: Effort normal. No respiratory distress.  Musculoskeletal: Normal range of motion.  Neurological: She is alert and oriented to person, place, and time.  Skin: Skin is warm and dry.  Large, tender, swollen area with induration to right lateral neck extending to supraclavicular area. Large hairy nevus right trapezius area. No erythema or drainage.   Psychiatric: She has a normal mood and affect. Her behavior is normal.  Nursing note and vitals reviewed.   ED Course  Procedures (including critical care time)  DIAGNOSTIC STUDIES: Oxygen Saturation is 100% on RA, normal by my interpretation.    COORDINATION OF CARE:  9:54 PM Discussed treatment plan with patient at bedside.  Patient acknowledges and agrees with plan.    Labs Review Labs Reviewed - No data to display Results for orders placed or performed during the hospital encounter of 04/26/14  I-Stat Chem 8, ED  Result Value Ref Range   Sodium 139 135 - 145 mmol/L   Potassium 3.7 3.5 - 5.1 mmol/L   Chloride 104 96 - 112 mmol/L   BUN 11 6 - 23 mg/dL   Creatinine, Ser 9.60 0.50 - 1.10 mg/dL   Glucose, Bld 454 (H) 70 - 99 mg/dL   Calcium, Ion 0.98 1.19 - 1.23 mmol/L   TCO2 19 0 - 100 mmol/L   Hemoglobin 13.9 12.0 - 15.0 g/dL   HCT 14.7 82.9 - 56.2 %   Ct Soft Tissue Neck W Contrast  04/27/2014   CLINICAL DATA:  History of superficial abscess in the posterior right neck. Patient believes it has recurred.  EXAM: CT NECK WITH CONTRAST  TECHNIQUE: Multidetector CT imaging of the neck was performed using the  standard protocol following the bolus administration of intravenous contrast.  CONTRAST:  OMNIPAQUE IOHEXOL 300 MG/ML  SOLN  COMPARISON:  11/01/2013  FINDINGS: There is a peripherally enhancing hypodense collection in the posterolateral right neck inferiorly, measuring 1.6 x 2.8 by 1.7 cm with an overlying wide area of dermal thickening and subcutaneous edema. The appearances are typical of cellulitis and subcutaneous abscess. No soft tissue gas is evident. The abscess extends down to the superficial aspect of the paraspinal muscles. This is the same location as the abscess observed on 11/01/2013. No other acute findings are evident in the neck.  Pharynx and larynx: Normal  Salivary glands: Normal  Thyroid: Normal  Lymph nodes: Multiple reactive appearing nodes in the anterior triangles, right greater than left.  Vascular: Normal  Limited intracranial: Normal  Visualized orbits: Normal  Mastoids and visualized paranasal  sinuses: Normal  Skeleton: Normal  Upper chest: There is a 12 mm right apical bleb.  Otherwise normal  IMPRESSION: Recurrent superficial abscess in the posterolateral inferior right neck with overlying cellulitis.   Electronically Signed   By: Ellery Plunk M.D.   On: 04/27/2014 01:49    Imaging Review No results found.   EKG Interpretation None      MDM   Final diagnoses:  None    1. Abscess, neck  Dr. Manus Gunning in to evaluate the patient. CT recommended and, if showing involvement of deep structures, ENT follow up for surgical I&D of neck abscess. CT showing superficial abscess and I&D was offered in ED which she declined. Patient started on abx, pain management. Referral to ENT provided.   I personally performed the services described in this documentation, which was scribed in my presence. The recorded information has been reviewed and is accurate.     Elpidio Anis, PA-C 04/27/14 0701  Glynn Octave, MD 04/27/14 [phone]

## 2014-04-26 NOTE — ED Notes (Signed)
Pt states that she had an abcess on the R side of neck that was i&d and she had a Ct scan that she does not know the results of. States it went away and has now come back. Alert and oriented.

## 2014-04-27 MED ORDER — IOHEXOL 300 MG/ML  SOLN
100.0000 mL | Freq: Once | INTRAMUSCULAR | Status: AC | PRN
  Administered 2014-04-27: 100 mL via INTRAVENOUS

## 2014-04-27 MED ORDER — CEPHALEXIN 500 MG PO CAPS: 500.0000 mg | ORAL_CAPSULE | Freq: Four times a day (QID) | ORAL | Status: DC

## 2014-04-27 MED ORDER — CEPHALEXIN 500 MG PO CAPS
500.0000 mg | ORAL_CAPSULE | Freq: Once | ORAL | Status: AC
  Administered 2014-04-27: 500 mg via ORAL
  Filled 2014-04-27: qty 1

## 2014-04-27 MED ORDER — OXYCODONE-ACETAMINOPHEN 5-325 MG PO TABS: 1.0000 | ORAL_TABLET | ORAL | Status: DC | PRN

## 2014-04-27 MED ORDER — OXYCODONE-ACETAMINOPHEN 5-325 MG PO TABS
1.0000 | ORAL_TABLET | Freq: Once | ORAL | Status: AC
  Administered 2014-04-27: 1 via ORAL
  Filled 2014-04-27: qty 1

## 2014-04-27 NOTE — Discharge Instructions (Signed)
Cellulitis Cellulitis is an infection of the skin and the tissue under the skin. The infected area is usually red and tender. This happens most often in the arms and lower legs. HOME CARE   Take your antibiotic medicine as told. Finish the medicine even if you start to feel better.  Keep the infected arm or leg raised (elevated).  Put a warm cloth on the area up to 4 times per day.  Only take medicines as told by your doctor.  Keep all doctor visits as told. GET HELP IF:  You see red streaks on the skin coming from the infected area.  Your red area gets bigger or turns a dark color.  Your bone or joint under the infected area is painful after the skin heals.  Your infection comes back in the same area or different area.  You have a puffy (swollen) bump in the infected area.  You have new symptoms.  You have a fever. GET HELP RIGHT AWAY IF:   You feel very sleepy.  You throw up (vomit) or have watery poop (diarrhea).  You feel sick and have muscle aches and pains. MAKE SURE YOU:   Understand these instructions.  Will watch your condition.  Will get help right away if you are not doing well or get worse. Document Released: 07/17/2007 Document Revised: 06/14/2013 Document Reviewed: 04/15/2011 [facility] Patient Information 2015 [place], [place]. This information is not intended to replace advice given to you by your health care provider. Make sure you discuss any questions you have with your health care provider.  Abscess An abscess is an infected area that contains a collection of pus and debris.It can occur in almost any part of the body. An abscess is also known as a furuncle or boil. CAUSES  An abscess occurs when tissue gets infected. This can occur from blockage of oil or sweat glands, infection of hair follicles, or a minor injury to the skin. As the body tries to fight the infection, pus collects in the area and creates pressure under the skin. This pressure causes  pain. People with weakened immune systems have difficulty fighting infections and get certain abscesses more often.  SYMPTOMS Usually an abscess develops on the skin and becomes a painful mass that is red, warm, and tender. If the abscess forms under the skin, you may feel a moveable soft area under the skin. Some abscesses break open (rupture) on their own, but most will continue to get worse without care. The infection can spread deeper into the body and eventually into the bloodstream, causing you to feel ill.  DIAGNOSIS  Your caregiver will take your medical history and perform a physical exam. A sample of fluid may also be taken from the abscess to determine what is causing your infection. TREATMENT  Your caregiver may prescribe antibiotic medicines to fight the infection. However, taking antibiotics alone usually does not cure an abscess. Your caregiver may need to make a small cut (incision) in the abscess to drain the pus. In some cases, gauze is packed into the abscess to reduce pain and to continue draining the area. HOME CARE INSTRUCTIONS   Only take over-the-counter or prescription medicines for pain, discomfort, or fever as directed by your caregiver.  If you were prescribed antibiotics, take them as directed. Finish them even if you start to feel better.  If gauze is used, follow your caregiver's directions for changing the gauze.  To avoid spreading the infection:  Keep your draining abscess covered  with a bandage.  Wash your hands well.  Do not share personal care items, towels, or whirlpools with others.  Avoid skin contact with others.  Keep your skin and clothes clean around the abscess.  Keep all follow-up appointments as directed by your caregiver. SEEK MEDICAL CARE IF:   You have increased pain, swelling, redness, fluid drainage, or bleeding.  You have muscle aches, chills, or a general ill feeling.  You have a fever. MAKE SURE YOU:   Understand these  instructions.  Will watch your condition.  Will get help right away if you are not doing well or get worse. Document Released: 11/07/2004 Document Revised: 07/30/2011 Document Reviewed: 04/12/2011 [facility] Patient Information 2015 [place], [place]. This information is not intended to replace advice given to you by your health care provider. Make sure you discuss any questions you have with your health care provider.

## 2014-12-09 ENCOUNTER — Other Ambulatory Visit: Payer: Self-pay | Admitting: Internal Medicine

## 2014-12-09 DIAGNOSIS — Z21 Asymptomatic human immunodeficiency virus [HIV] infection status: Secondary | ICD-10-CM

## 2014-12-09 NOTE — Addendum Note (Signed)
Addended by: Jennet MaduroESTRIDGE, Juliahna Wiswell D on: 12/09/2014 11:32 AM   Modules accepted: Orders

## 2014-12-13 ENCOUNTER — Other Ambulatory Visit: Payer: Medicaid Other

## 2014-12-14 ENCOUNTER — Other Ambulatory Visit (HOSPITAL_COMMUNITY)
Admission: RE | Admit: 2014-12-14 | Discharge: 2014-12-14 | Disposition: A | Discharge status: Home / Self Care | Payer: Medicaid Other | Source: Ambulatory Visit | Attending: Internal Medicine | Admitting: Internal Medicine

## 2014-12-14 ENCOUNTER — Other Ambulatory Visit: Payer: Medicaid Other

## 2014-12-14 DIAGNOSIS — Z21 Asymptomatic human immunodeficiency virus [HIV] infection status: Secondary | ICD-10-CM

## 2014-12-14 DIAGNOSIS — Z113 Encounter for screening for infections with a predominantly sexual mode of transmission: Secondary | ICD-10-CM

## 2014-12-14 DIAGNOSIS — Z79899 Other long term (current) drug therapy: Secondary | ICD-10-CM

## 2014-12-14 DIAGNOSIS — N760 Acute vaginitis: Secondary | ICD-10-CM | POA: Diagnosis present

## 2014-12-14 DIAGNOSIS — B20 Human immunodeficiency virus [HIV] disease: Secondary | ICD-10-CM

## 2014-12-14 NOTE — Addendum Note (Signed)
Addended by: Mariea ClontsGREEN, Shemeca Lukasik D on: 12/14/2014 05:24 PM   Modules accepted: Orders

## 2014-12-15 LAB — COMPLETE METABOLIC PANEL WITH GFR
ALT: 16 U/L (ref 6–29)
AST: 22 U/L (ref 10–30)
Albumin: 3.4 g/dL — ABNORMAL LOW (ref 3.6–5.1)
Alkaline Phosphatase: 98 U/L (ref 33–115)
BUN: 9 mg/dL (ref 7–25)
CHLORIDE: 106 mmol/L (ref 98–110)
CO2: 26 mmol/L (ref 20–31)
Calcium: 8.4 mg/dL — ABNORMAL LOW (ref 8.6–10.2)
Creat: 0.87 mg/dL (ref 0.50–1.10)
GFR, Est African American: >89 mL/min (ref >=60)
GFR, Est Non African American: 83 mL/min (ref >=60)
Glucose, Bld: 74 mg/dL (ref 65–99)
Potassium: 4 mmol/L (ref 3.5–5.3)
Sodium: 134 mmol/L — ABNORMAL LOW (ref 135–146)
Total Bilirubin: 0.4 mg/dL (ref 0.2–1.2)
Total Protein: 7.4 g/dL (ref 6.1–8.1)

## 2014-12-15 LAB — LIPID PANEL
CHOL/HDL RATIO: 3.8 ratio (ref <=5.0)
Cholesterol: 118 mg/dL — ABNORMAL LOW (ref 125–200)
HDL: 31 mg/dL — AB (ref >=46)
LDL CALC: 70 mg/dL (ref <130)
Triglycerides: 85 mg/dL (ref <150)
VLDL: 17 mg/dL (ref <30)

## 2014-12-15 LAB — CBC WITH DIFFERENTIAL/PLATELET
BASOS ABS: 0 10*3/uL (ref 0.0–0.1)
Basophils Relative: 0 % (ref 0–1)
EOS ABS: 0.1 10*3/uL (ref 0.0–0.7)
Eosinophils Relative: 1 % (ref 0–5)
HCT: 34.1 % — ABNORMAL LOW (ref 36.0–46.0)
Hemoglobin: 11 g/dL — ABNORMAL LOW (ref 12.0–15.0)
LYMPHS PCT: 63 % — AB (ref 12–46)
Lymphs Abs: 3.7 10*3/uL (ref 0.7–4.0)
MCH: 24.3 pg — ABNORMAL LOW (ref 26.0–34.0)
MCHC: 32.3 g/dL (ref 30.0–36.0)
MCV: 75.3 fL — ABNORMAL LOW (ref 78.0–100.0)
Monocytes Absolute: 0.4 10*3/uL (ref 0.1–1.0)
Monocytes Relative: 7 % (ref 3–12)
Neutro Abs: 1.7 10*3/uL (ref 1.7–7.7)
Neutrophils Relative %: 29 % — ABNORMAL LOW (ref 43–77)
PLATELETS: 89 10*3/uL — AB (ref 150–400)
RBC: 4.53 MIL/uL (ref 3.87–5.11)
RDW: 18.3 % — AB (ref 11.5–15.5)
WBC: 5.8 10*3/uL (ref 4.0–10.5)

## 2014-12-15 LAB — T-HELPER CELL (CD4) - (RCID CLINIC ONLY)
CD4 T CELL HELPER: 15 % — AB (ref 33–55)
CD4 T Cell Abs: 600 /uL (ref 400–2700)

## 2014-12-15 LAB — RPR

## 2014-12-16 ENCOUNTER — Other Ambulatory Visit: Payer: Self-pay | Admitting: Internal Medicine

## 2014-12-16 DIAGNOSIS — A5901 Trichomonal vulvovaginitis: Secondary | ICD-10-CM

## 2014-12-16 LAB — URINE CYTOLOGY ANCILLARY ONLY
Chlamydia: NEGATIVE
NEISSERIA GONORRHEA: NEGATIVE
Trichomonas: POSITIVE — AB

## 2014-12-16 MED ORDER — METRONIDAZOLE 500 MG PO TABS: 2000.0000 mg | ORAL_TABLET | Freq: Once | ORAL | Status: DC

## 2014-12-18 LAB — HIV-1 RNA ULTRAQUANT REFLEX TO GENTYP+
HIV 1 RNA QUANT: 4852 {copies}/mL — AB (ref <20)
HIV-1 RNA Quant, Log: 3.69 Log copies/mL — ABNORMAL HIGH (ref <1.30)

## 2014-12-21 LAB — HLA B*5701: HLA-B 5701 W/RFLX HLA-B HIGH: NEGATIVE

## 2014-12-23 ENCOUNTER — Ambulatory Visit: Payer: Medicaid Other

## 2014-12-23 LAB — HIV-1 GENOTYPR PLUS

## 2014-12-27 ENCOUNTER — Ambulatory Visit: Payer: Medicaid Other | Admitting: Internal Medicine

## 2015-01-11 ENCOUNTER — Ambulatory Visit: Payer: Medicaid Other | Admitting: Internal Medicine

## 2015-01-17 ENCOUNTER — Ambulatory Visit: Payer: Medicaid Other | Admitting: Internal Medicine

## 2015-01-25 ENCOUNTER — Ambulatory Visit: Payer: Medicaid Other | Admitting: Internal Medicine

## 2015-01-26 ENCOUNTER — Ambulatory Visit: Payer: Medicaid Other | Admitting: Internal Medicine

## 2015-02-27 ENCOUNTER — Emergency Department (HOSPITAL_COMMUNITY)
Admission: EM | Admit: 2015-02-27 | Discharge: 2015-02-27 | Disposition: A | Discharge status: Home / Self Care | Payer: Medicaid Other | Attending: Emergency Medicine | Admitting: Emergency Medicine

## 2015-02-27 ENCOUNTER — Emergency Department (HOSPITAL_COMMUNITY): Payer: Medicaid Other

## 2015-02-27 ENCOUNTER — Encounter (HOSPITAL_COMMUNITY): Payer: Self-pay

## 2015-02-27 ENCOUNTER — Encounter (HOSPITAL_COMMUNITY): Payer: Self-pay | Admitting: *Deleted

## 2015-02-27 ENCOUNTER — Emergency Department (HOSPITAL_COMMUNITY)
Admission: EM | Admit: 2015-02-27 | Discharge: 2015-02-27 | Disposition: A | Discharge status: Home / Self Care | Payer: Medicaid Other

## 2015-02-27 ENCOUNTER — Emergency Department (INDEPENDENT_AMBULATORY_CARE_PROVIDER_SITE_OTHER)
Admission: EM | Admit: 2015-02-27 | Discharge: 2015-02-27 | Disposition: A | Discharge status: Nursing Home (Includes ICF) | Payer: Medicaid Other | Source: Home / Self Care | Attending: Family Medicine | Admitting: Family Medicine

## 2015-02-27 DIAGNOSIS — L0211 Cutaneous abscess of neck: Secondary | ICD-10-CM

## 2015-02-27 DIAGNOSIS — Z79899 Other long term (current) drug therapy: Secondary | ICD-10-CM | POA: Diagnosis not present

## 2015-02-27 DIAGNOSIS — I10 Essential (primary) hypertension: Secondary | ICD-10-CM | POA: Insufficient documentation

## 2015-02-27 DIAGNOSIS — F17210 Nicotine dependence, cigarettes, uncomplicated: Secondary | ICD-10-CM | POA: Diagnosis not present

## 2015-02-27 DIAGNOSIS — L0291 Cutaneous abscess, unspecified: Secondary | ICD-10-CM

## 2015-02-27 LAB — I-STAT CREATININE, ED: Creatinine, Ser: 0.8 mg/dL (ref 0.44–1.00)

## 2015-02-27 MED ORDER — LIDOCAINE-EPINEPHRINE 2 %-1:100000 IJ SOLN
INTRAMUSCULAR | Status: AC
  Administered 2015-02-27: 22:00:00
  Filled 2015-02-27: qty 1

## 2015-02-27 MED ORDER — SULFAMETHOXAZOLE-TRIMETHOPRIM 800-160 MG PO TABS: 1.0000 | ORAL_TABLET | Freq: Two times a day (BID) | ORAL | Status: AC

## 2015-02-27 MED ORDER — IOHEXOL 300 MG/ML  SOLN
75.0000 mL | Freq: Once | INTRAMUSCULAR | Status: AC | PRN
  Administered 2015-02-27: 75 mL via INTRAVENOUS

## 2015-02-27 MED ORDER — OXYCODONE-ACETAMINOPHEN 5-325 MG PO TABS
2.0000 | ORAL_TABLET | Freq: Once | ORAL | Status: AC
  Administered 2015-02-27: 2 via ORAL
  Filled 2015-02-27: qty 2

## 2015-02-27 NOTE — ED Notes (Signed)
Pt presents with c/o abscess on her neck that she noticed on Saturday. Pt reports the area is tender to touch, no drainage noted by pt.

## 2015-02-27 NOTE — ED Provider Notes (Signed)
CSN: [id number]     Arrival date & time 02/27/15  1312 History   First MD Initiated Contact with Patient 02/27/15 1414     Chief Complaint  Patient presents with  . Abscess   (Consider location/radiation/quality/duration/timing/severity/associated sxs/prior Treatment) Patient is a 40 y.o. female presenting with abscess. The history is provided by the patient.  Abscess Location:  Head/neck Head/neck abscess location:  R neck Abscess quality: fluctuance, induration and painful   Red streaking: no   Duration:  2 days Progression:  Worsening Pain details:    Severity:  Moderate   Duration:  2 days   Progression:  Worsening Chronicity:  New Relieved by:  None tried Worsened by:  Nothing tried Ineffective treatments:  None tried Associated symptoms: no fever     Past Medical History  Diagnosis Date  . Hypertension    History reviewed. No pertinent past surgical history. Family History  Problem Relation Age of Onset  . Hypertension Other    Social History  Substance Use Topics  . Smoking status: Current Every Day Smoker -- 0.40 packs/day    Types: Cigarettes  . Smokeless tobacco: None     Comment: trying to slow down  . Alcohol Use: No     Comment: weekends   OB History    No data available     Review of Systems  Constitutional: Negative for fever.  Musculoskeletal: Positive for neck pain.  Skin: Positive for rash.  All other systems reviewed and are negative.   Allergies  Review of patient's allergies indicates no known allergies.  Home Medications   Prior to Admission medications   Medication Sig Start Date End Date Taking? Authorizing Provider  albuterol (PROVENTIL HFA;VENTOLIN HFA) 108 (90 BASE) MCG/ACT inhaler Inhale 1 puff into the lungs every 6 (six) hours as needed for wheezing or shortness of breath.    Historical Provider, MD  albuterol (PROVENTIL HFA;VENTOLIN HFA) 108 (90 BASE) MCG/ACT inhaler Inhale 1-2 puffs into the lungs every 6 (six) hours as  needed for wheezing or shortness of breath. 01/29/14   Derwood Kaplan, MD  azithromycin (ZITHROMAX) 250 MG tablet Take 1 tablet (250 mg total) by mouth daily. Take first 2 tablets together, then 1 every day until finished. 01/29/14   Derwood Kaplan, MD  cephALEXin (KEFLEX) 500 MG capsule Take 1 capsule (500 mg total) by mouth 4 (four) times daily. 04/27/14   Elpidio Anis, PA-C  guaiFENesin (MUCINEX) 600 MG 12 hr tablet Take 600 mg by mouth 2 (two) times daily.    Historical Provider, MD  guaifenesin (ROBITUSSIN) 100 MG/5ML syrup Take 200 mg by mouth 3 (three) times daily as needed for cough.    Historical Provider, MD  hydrochlorothiazide (HYDRODIURIL) 50 MG tablet take 1 tablet by mouth once daily Patient taking differently: Take 50 mg by mouth daily.  11/22/13   Cliffton Asters, MD  HYDROcodone-acetaminophen (NORCO) 5-325 MG per tablet Take 2 tablets by mouth every 4 (four) hours as needed. Patient not taking: Reported on 01/29/2014 10/10/13   Arthor Captain, PA-C  metroNIDAZOLE (FLAGYL) 500 MG tablet Take 4 tablets (2,000 mg total) by mouth once. 12/16/14   Cliffton Asters, MD  nicotine (NICODERM CQ) 21 mg/24hr patch Place 1 patch (21 mg total) onto the skin daily. Patient not taking: Reported on 01/29/2014 05/04/13   Ivonne Andrew, PA-C  nicotine polacrilex (NICORETTE) 4 MG gum Take 1 each (4 mg total) by mouth as needed for smoking cessation. Patient not taking: Reported on 01/29/2014 05/04/13  Ivonne AndrewPeter Dammen, PA-C  oxyCODONE-acetaminophen (PERCOCET/ROXICET) 5-325 MG per tablet Take 1-2 tablets by mouth every 4 (four) hours as needed for severe pain. 04/27/14   Elpidio AnisShari Upstill, PA-C  Phenyleph-Doxylamine-DM-APAP (ALKA-SELTZER PLUS COLD & FLU) 10-12.5-20-650 MG PACK Take 1 Package by mouth every 6 (six) hours as needed (cough).    Historical Provider, MD  Phenylephrine-DM-GG-APAP (TYLENOL COLD HEAD CONGESTION) 5-10-200-325 MG TABS Take 2 tablets by mouth every 6 (six) hours as needed. cough    Historical  Provider, MD  predniSONE (DELTASONE) 50 MG tablet Take 1 tablet (50 mg total) by mouth daily. 01/29/14   Derwood KaplanAnkit Nanavati, MD   Meds Ordered and Administered this Visit  Medications - No data to display  BP 169/103 mmHg  Pulse 97  Temp(Src) 98.4 F (36.9 C) (Oral)  SpO2 100%  LMP 02/01/2015 No data found.   Physical Exam  Constitutional: She is oriented to person, place, and time. She appears well-developed and well-nourished. She appears distressed.  Neurological: She is alert and oriented to person, place, and time.  Skin: Skin is warm and dry.  Tender 3x5cm centrally fluctuant abscess to right post neck,   Nursing note and vitals reviewed.   ED Course  Procedures (including critical care time)  Labs Review Labs Reviewed - No data to display  Imaging Review No results found.   Visual Acuity Review  Right Eye Distance:   Left Eye Distance:   Bilateral Distance:    Right Eye Near:   Left Eye Near:    Bilateral Near:         MDM   1. Abscess of skin of neck    Sent for surg eval and treatment of right neck abscess.    Linna HoffJames D Clea Dubach, MD 02/27/15 1440

## 2015-02-27 NOTE — ED Provider Notes (Signed)
CSN: [id number]     Arrival date & time 02/27/15  1635 History  By signing my name below, I, [name], attest that this documentation has been prepared under the direction and in the presence of [employer], PA-C. Electronically Signed: [name], ED Scribe. 02/27/2015. 6:57 PM.   Chief Complaint  Patient presents with  . Abscess   The history is provided by the patient. No language interpreter was used.  HPI Comments: Melissa Sloan is a 40 y.o. female who presents to the Emergency Department complaining of an abscess of the right neck onset 3 days ago. Pt was seen at Urgent Care today for these symptoms and was sent to the ED for surgical evaluation. Pt states that the area is tender to the touch. She reports hx of similar symptoms, but states that they have gone away on their own without I&D or ENT intervention. She denies drainage from the area, fever, chills, nausea, vomiting, trouble swallowing, SOB, or color change.   Past Medical History  Diagnosis Date  . Hypertension    History reviewed. No pertinent past surgical history. Family History  Problem Relation Age of Onset  . Hypertension Other    Social History  Substance Use Topics  . Smoking status: Current Every Day Smoker -- 0.40 packs/day    Types: Cigarettes  . Smokeless tobacco: None     Comment: trying to slow down  . Alcohol Use: No     Comment: weekends   OB History    No data available     Review of Systems  Constitutional: Negative for fever and chills.  HENT: Negative for trouble swallowing.   Respiratory: Negative for shortness of breath.   Gastrointestinal: Negative for nausea and vomiting.  Musculoskeletal: Positive for neck pain.  Skin: Positive for wound. Negative for color change.    Allergies  Review of patient's allergies indicates no known allergies.  Home Medications   Prior to Admission medications   Medication Sig Start Date End Date Taking? Authorizing Provider   albuterol (PROVENTIL HFA;VENTOLIN HFA) 108 (90 BASE) MCG/ACT inhaler Inhale 1-2 puffs into the lungs every 6 (six) hours as needed for wheezing or shortness of breath. 01/29/14  Yes Derwood Kaplan, MD  hydrochlorothiazide (HYDRODIURIL) 50 MG tablet take 1 tablet by mouth once daily Patient taking differently: Take 50 mg by mouth daily.  11/22/13  Yes Cliffton Asters, MD  nicotine (NICODERM CQ) 21 mg/24hr patch Place 1 patch (21 mg total) onto the skin daily. Patient not taking: Reported on 01/29/2014 05/04/13   Ivonne Andrew, PA-C  nicotine polacrilex (NICORETTE) 4 MG gum Take 1 each (4 mg total) by mouth as needed for smoking cessation. Patient not taking: Reported on 01/29/2014 05/04/13   Ivonne Andrew, PA-C  sulfamethoxazole-trimethoprim (BACTRIM DS,SEPTRA DS) 800-160 MG tablet Take 1 tablet by mouth 2 (two) times daily. 02/27/15 03/06/15  Tinnie Gens Ermon Sagan, PA-C   BP 145/69 mmHg  Pulse 56  Temp(Src) 98.1 F (36.7 C) (Oral)  SpO2 100%  LMP 02/01/2015 Physical Exam  Constitutional: She is oriented to person, place, and time. She appears well-developed and well-nourished. No distress.  HENT:  Head: Normocephalic and atraumatic.  Mouth/Throat: Oropharynx is clear and moist. No oropharyngeal exudate.  Eyes: Conjunctivae and EOM are normal. Pupils are equal, round, and reactive to light.  Neck: Normal range of motion. Neck supple.  Musculoskeletal: Normal range of motion.  2+ radial pulse, distal sensation of right hand intact  Neurological: She is alert and oriented to person,  place, and time.  Skin: Skin is warm and dry.  7-8 cm abscess to the right lateral aspect of neck with fluctuance, warm to the touch and erythematous; no surrounding induration to palpation  Psychiatric: She has a normal mood and affect. Her behavior is normal.    ED Course  Procedures (including critical care time) DIAGNOSTIC STUDIES: Oxygen Saturation is 100% on RA, normal by my interpretation.    COORDINATION OF  CARE: 6:55 PM-Discussed treatment plan which includes imaging of area and consulting with attending provider with pt at bedside and pt agreed to plan.   INCISION AND DRAINAGE PROCEDURE NOTE: Patient identification was confirmed and verbal consent was obtained. This procedure was performed by Eyvonne Mechanic, PA-C at 9:40 PM. Site: right lateral neck Sterile procedures observed Needle size: 25 Anesthetic used (type and amt): Lidocaine with epi- 6 ML's Blade size: 11 Drainage: Purulent, 4 ML's Complexity: Complex Packing used none Site anesthetized, incision made over site, wound drained and explored loculations, rinsed with copious amounts of normal saline, wound packed with sterile gauze, covered with dry, sterile dressing.  Pt tolerated procedure well without complications.  Instructions for care discussed verbally and pt provided with additional written instructions for homecare and f/u.   Labs Review Labs Reviewed  I-STAT CREATININE, ED    Imaging Review Ct Soft Tissue Neck W Contrast  02/27/2015  CLINICAL DATA:  RIGHT neck abscess for 3 days, pending surgical evaluation. History of similar symptoms, spontaneously resolved. History of HIV, hypertension. EXAM: CT NECK WITH CONTRAST TECHNIQUE: Multidetector CT imaging of the neck was performed using the standard protocol following the bolus administration of intravenous contrast. CONTRAST:  75mL OMNIPAQUE IOHEXOL 300 MG/ML  SOLN COMPARISON:  CT neck April 27, 2014 FINDINGS: Pharynx and larynx: Fullness of the nasopharyngeal soft tissues can be seen with patient's known HIV. Aerodigestive tract is otherwise unremarkable. Salivary glands: Normal. Thyroid: Normal. Lymph nodes: Multiple RIGHT level 5a hyper enhancing lymph nodes measure up to 12 mm short axis. Vascular: Normal. Limited intracranial: Normal. Mastoids and visualized paranasal sinuses: Well aerated. Skeleton/soft tissues: Within RIGHT posterior neck is (corresponding to skin  marker) subcutaneous fat, posterior to the sternocleidomastoid muscle is a 3.7 x 2.5 cm (AP by transverse) rim enhancing fluid collection, in similar location to prior abscess which measured 2.8 x 1.6 cm. Overlying skin thickening compatible with cellulitis. No subcutaneous gas radiopaque foreign bodies. No definite subfascial invasion. Straightened cervical lordosis. No destructive bony lesions. Upper chest: Minimal RIGHT apical bullous changes. No superior mediastinal lymphadenopathy. IMPRESSION: Recurrent 3.7 x 2.5 cm superficial abscess in RIGHT posterolateral neck subcutaneous fat without subfascial extent. RIGHT level 5 A lymphadenopathy is likely reactive. Electronically Signed   By: Awilda Metro M.D.   On: 02/27/2015 21:18   I have personally reviewed and evaluated these images and lab results as part of my medical decision-making.   EKG Interpretation None      MDM   Final diagnoses:  Abscess   Labs: I-STAT creatinine  Imaging: CT  Consults:  Therapeutics:  Discharge Meds:   Assessment/Plan:  40 year old female presents today with an abscess. Patient was initially evaluated by previous provider with patient care transfer shift change. She has an abscess to her neck, previously treated with antibiotics and no follow-up. She returns and with the abscess with no systemic symptoms. The abscess was located on the right lateral neck, CT scan showed no deep space involvement at this time. The abscess was fluctuant and a linear manner from the anterior  posterior aspect of the neck, I elected to make incision in that direction due to the size of the abscess, dislocation, its superficial nature, and need for sufficient drainage and felt that cranial caudal direction would not be sufficient for this abscess. Patient tolerated the procedure well with no complications, she will be instructed to follow-up in 2 days for wound check. She'll be placed on prophylactic antibiotics. She was given  strict return precautions she verbalized understanding and agreement for today's plan and had no further questions or concerns at time of discharge.        Eyvonne MechanicJeffrey Ryleigh Buenger, PA-C 02/27/15 2243  Eyvonne MechanicJeffrey Langston [place], PA-C 02/27/15 2244  Lavera Guiseana Duo Liu, MD 02/28/15 [phone]

## 2015-02-27 NOTE — ED Notes (Signed)
Pt  Has  An  abcess   To  r  Side  Of  Her  Neck  That  Has   Appeared   sev  Days  Ago   -  aitr  Way  Is  Intact   Speaking in   Complete  sentances           Pt    Has  A   Birthmark  As   Well   On the  r  Side  Of  Her  Neck

## 2015-02-27 NOTE — Discharge Instructions (Signed)
Incision and Drainage °Incision and drainage is a procedure in which a sac-like structure (cystic structure) is opened and drained. The area to be drained usually contains material such as pus, fluid, or blood.  °LET YOUR CAREGIVER KNOW ABOUT:  °· Allergies to medicine. °· Medicines taken, including vitamins, herbs, eyedrops, over-the-counter medicines, and creams. °· Use of steroids (by mouth or creams). °· Previous problems with anesthetics or numbing medicines. °· History of bleeding problems or blood clots. °· Previous surgery. °· Other health problems, including diabetes and kidney problems. °· Possibility of pregnancy, if this applies. °RISKS AND COMPLICATIONS °· Pain. °· Bleeding. °· Scarring. °· Infection. °BEFORE THE PROCEDURE  °You may need to have an ultrasound or other imaging tests to see how large or deep your cystic structure is. Blood tests may also be used to determine if you have an infection or how severe the infection is. You may need to have a tetanus shot. °PROCEDURE  °The affected area is cleaned with a cleaning fluid. The cyst area will then be numbed with a medicine (local anesthetic). A small incision will be made in the cystic structure. A syringe or catheter may be used to drain the contents of the cystic structure, or the contents may be squeezed out. The area will then be flushed with a cleansing solution. After cleansing the area, it is often gently packed with a gauze or another wound dressing. Once it is packed, it will be covered with gauze and tape or some other type of wound dressing.  °AFTER THE PROCEDURE  °· Often, you will be allowed to go home right after the procedure. °· You may be given antibiotic medicine to prevent or heal an infection. °· If the area was packed with gauze or some other wound dressing, you will likely need to come back in 1 to 2 days to get it removed. °· The area should heal in about 14 days. °  °This information is not intended to replace advice given  to you by your health care provider. Make sure you discuss any questions you have with your health care provider. °  °Document Released: 07/24/2000 Document Revised: 07/30/2011 Document Reviewed: 03/25/2011 °Elsevier Interactive Patient Education ©2016 Else[employer] Inc[employer] ° °Abscess °An abscess is an infected area that contains a collection of pus and debris. It can occur in almost any part of the body. An abscess is also known as a furuncle or boil. °CAUSES  °An abscess occurs when tissue gets infected. This can occur from blockage of oil or sweat glands, infection of hair follicles, or a minor injury to the skin. As the body tries to fight the infection, pus collects in the area and creates pressure under the skin. This pressure causes pain. People with weakened immune systems have difficulty fighting infections and get certain abscesses more often.  °SYMPTOMS °Usually an abscess develops on the skin and becomes a painful mass that is red, warm, and tender. If the abscess forms under the skin, you may feel a moveable soft area under the skin. Some abscesses break open (rupture) on their own, but most will continue to get worse without care. The infection can spread deeper into the body and eventually into the bloodstream, causing you to feel ill.  °DIAGNOSIS  °Your caregiver will take your medical history and perform a physical exam. A sample of fluid may also be taken from the abscess to determine what is causing your infection. °TREATMENT  °Your caregiver may prescribe antibiotic medicines to fight the infection.   However, taking antibiotics alone usually does not cure an abscess. Your caregiver may need to make a small cut (incision) in the abscess to drain the pus. In some cases, gauze is packed into the abscess to reduce pain and to continue draining the area. HOME CARE INSTRUCTIONS   Only take over-the-counter or prescription medicines for pain, discomfort, or fever as directed by your caregiver.  If you were  prescribed antibiotics, take them as directed. Finish them even if you start to feel better.  If gauze is used, follow your caregiver's directions for changing the gauze.  To avoid spreading the infection:  Keep your draining abscess covered with a bandage.  Wash your hands well.  Do not share personal care items, towels, or whirlpools with others.  Avoid skin contact with others.  Keep your skin and clothes clean around the abscess.  Keep all follow-up appointments as directed by your caregiver. SEEK MEDICAL CARE IF:   You have increased pain, swelling, redness, fluid drainage, or bleeding.  You have muscle aches, chills, or a general ill feeling.  You have a fever. MAKE SURE YOU:   Understand these instructions.  Will watch your condition.  Will get help right away if you are not doing well or get worse.   This information is not intended to replace advice given to you by your health care provider. Make sure you discuss any questions you have with your health care provider.   Document Released: 11/07/2004 Document Revised: 07/30/2011 Document Reviewed: 04/12/2011 Elsevier Interactive Patient Education [employer]  Please follow-up in 2 days for reevaluation of wound. , Please keep wound clean, with regular irrigations. Please return immediately if any new or worsening signs or symptoms present.

## 2015-02-27 NOTE — ED Notes (Signed)
Patient was alert, oriented and stable upon discharge. RN went over AVS and patient had no further questions.  

## 2015-02-27 NOTE — ED Notes (Signed)
Patient left AMA.

## 2015-03-02 ENCOUNTER — Emergency Department (HOSPITAL_COMMUNITY)
Admission: EM | Admit: 2015-03-02 | Discharge: 2015-03-02 | Disposition: A | Discharge status: Home / Self Care | Payer: Medicaid Other | Attending: Emergency Medicine | Admitting: Emergency Medicine

## 2015-03-02 ENCOUNTER — Encounter (HOSPITAL_COMMUNITY): Payer: Self-pay | Admitting: *Deleted

## 2015-03-02 DIAGNOSIS — I10 Essential (primary) hypertension: Secondary | ICD-10-CM | POA: Insufficient documentation

## 2015-03-02 DIAGNOSIS — F17210 Nicotine dependence, cigarettes, uncomplicated: Secondary | ICD-10-CM | POA: Insufficient documentation

## 2015-03-02 DIAGNOSIS — Z760 Encounter for issue of repeat prescription: Secondary | ICD-10-CM | POA: Diagnosis not present

## 2015-03-02 DIAGNOSIS — Z4800 Encounter for change or removal of nonsurgical wound dressing: Secondary | ICD-10-CM | POA: Insufficient documentation

## 2015-03-02 DIAGNOSIS — Z5189 Encounter for other specified aftercare: Secondary | ICD-10-CM

## 2015-03-02 DIAGNOSIS — Z79899 Other long term (current) drug therapy: Secondary | ICD-10-CM | POA: Insufficient documentation

## 2015-03-02 MED ORDER — HYDROCHLOROTHIAZIDE 50 MG PO TABS: ORAL_TABLET | ORAL | Status: AC

## 2015-03-02 MED ORDER — HYDROCHLOROTHIAZIDE 25 MG PO TABS: 25.0000 mg | ORAL_TABLET | Freq: Every day | ORAL | Status: DC

## 2015-03-02 NOTE — ED Provider Notes (Signed)
CSN: [id number]     Arrival date & time 03/02/15  1432 History  By signing my name below, I, Melissa Sloan, attest that this documentation has been prepared under the direction and in the presence of Melissa Sloan, [place].  Electronically Signed: Tanda Sloan, ED Scribe. 03/02/2015. 4:05 PM.   Chief Complaint  Patient presents with  . Wound Check  . Medication Refill   The history is provided by the patient. No language interpreter was used.     HPI Comments: Melissa Sloan is a 40 y.o. female who presents to the Emergency Department for wound check. Pt was seen in the ED on 02/27/2015 (2 days ago) for an area of redness, swelling, and pain to the right neck. Pt had an I&D done and was told to return in 2 days for wound check. She states that the wound is healing well but is still mildly painful and itchy in sensation. Denies fever, chills, or any other associated symptoms.   Pt is also requesting HCTZ 50 mg prescription. She has 2 pills left and does not have a PCP currently to write her a new prescription.   Past Medical History  Diagnosis Date  . Hypertension    History reviewed. No pertinent past surgical history. Family History  Problem Relation Age of Onset  . Hypertension Other    Social History  Substance Use Topics  . Smoking status: Current Every Day Smoker -- 0.40 packs/day    Types: Cigarettes  . Smokeless tobacco: None     Comment: trying to slow down  . Alcohol Use: No     Comment: weekends   OB History    No data available     Review of Systems  Constitutional: Negative for fever and chills.  Skin:       + Abscess to right neck  All other systems reviewed and are negative.  Allergies  Review of patient's allergies indicates no known allergies.  Home Medications   Prior to Admission medications   Medication Sig Start Date End Date Taking? Authorizing Provider  albuterol (PROVENTIL HFA;VENTOLIN HFA) 108 (90 BASE) MCG/ACT inhaler Inhale 1-2 puffs into  the lungs every 6 (six) hours as needed for wheezing or shortness of breath. 01/29/14  Yes Derwood Kaplan, MD  hydrochlorothiazide (HYDRODIURIL) 50 MG tablet take 1 tablet by mouth once daily Patient taking differently: Take 50 mg by mouth daily.  11/22/13  Yes Cliffton Asters, MD  naproxen sodium (ANAPROX) 220 MG tablet Take 220 mg by mouth 3 (three) times daily as needed (pain).   Yes Historical Provider, MD  sulfamethoxazole-trimethoprim (BACTRIM DS,SEPTRA DS) 800-160 MG tablet Take 1 tablet by mouth 2 (two) times daily. 02/27/15 03/06/15 Yes Jeffrey Hedges, PA-C  traMADol (ULTRAM) 50 MG tablet Take 50 mg by mouth every 12 (twelve) hours as needed for moderate pain.   Yes Historical Provider, MD  nicotine (NICODERM CQ) 21 mg/24hr patch Place 1 patch (21 mg total) onto the skin daily. Patient not taking: Reported on 01/29/2014 05/04/13   Ivonne Andrew, PA-C  nicotine polacrilex (NICORETTE) 4 MG gum Take 1 each (4 mg total) by mouth as needed for smoking cessation. Patient not taking: Reported on 01/29/2014 05/04/13   Ivonne Andrew, PA-C   Triage Vitals:  BP 168/119 mmHg  Pulse 86  Temp(Src) 97.9 F (36.6 C) (Oral)  Resp 18  SpO2 100%  LMP 02/01/2015   Physical Exam  Constitutional: She is oriented to person, place, and time. She appears well-developed and well-nourished.  No distress.  HENT:  Head: Normocephalic and atraumatic.  Eyes: Conjunctivae and EOM are normal.  Neck: Neck supple. No tracheal deviation present.  Cardiovascular: Normal rate.   Pulmonary/Chest: Effort normal. No respiratory distress.  Musculoskeletal: Normal range of motion.  Neurological: She is alert and oriented to person, place, and time.  Skin: Skin is warm and dry.  Healing incision to right neck Small amount of foul smelling drainage from incision site Large birthmark to right neck; pt states birthmark is unchanged  Psychiatric: She has a normal mood and affect. Her behavior is normal.  Nursing note and vitals  reviewed.   ED Course  Procedures (including critical care time)  DIAGNOSTIC STUDIES: Oxygen Saturation is 100% on RA, normal by my interpretation.    COORDINATION OF CARE: 4:02 PM-Discussed treatment plan which includes continuing with antibiotics and Rx HCTZ with pt at bedside and pt agreed to plan.   Labs Review Labs Reviewed - No data to display  Imaging Review No results found.   EKG Interpretation None      MDM   Final diagnoses:  Wound check, abscess    Continue wound care hctz     Elson Areas, PA-C 03/02/15 1620  Eber Hong, MD 03/04/15 0009

## 2015-03-02 NOTE — ED Notes (Signed)
Pt here for wound check of abscess that was drained 2 days ago. Pt states the drainage stopped yesterday. Pt states she is also running out of her HCTZ medication. Pt denies pain, states it is itching.

## 2015-03-02 NOTE — Discharge Instructions (Signed)
Hypertension Hypertension, commonly called high blood pressure, is when the force of blood pumping through your arteries is too strong. Your arteries are the blood vessels that carry blood from your heart throughout your body. A blood pressure reading consists of a higher number over a lower number, such as 110/72. The higher number (systolic) is the pressure inside your arteries when your heart pumps. The lower number (diastolic) is the pressure inside your arteries when your heart relaxes. Ideally you want your blood pressure below 120/80. Hypertension forces your heart to work harder to pump blood. Your arteries may become narrow or stiff. Having untreated or uncontrolled hypertension can cause heart attack, stroke, kidney disease, and other problems. RISK FACTORS Some risk factors for high blood pressure are controllable. Others are not.  Risk factors you cannot control include:   Race. You may be at higher risk if you are African American.  Age. Risk increases with age.  Gender. Men are at higher risk than women before age 45 years. After age 65, women are at higher risk than men. Risk factors you can control include:  Not getting enough exercise or physical activity.  Being overweight.  Getting too much fat, sugar, calories, or salt in your diet.  Drinking too much alcohol. SIGNS AND SYMPTOMS Hypertension does not usually cause signs or symptoms. Extremely high blood pressure (hypertensive crisis) may cause headache, anxiety, shortness of breath, and nosebleed. DIAGNOSIS To check if you have hypertension, your health care provider will measure your blood pressure while you are seated, with your arm held at the level of your heart. It should be measured at least twice using the same arm. Certain conditions can cause a difference in blood pressure between your right and left arms. A blood pressure reading that is higher than normal on one occasion does not mean that you need treatment. If  it is not clear whether you have high blood pressure, you may be asked to return on a different day to have your blood pressure checked again. Or, you may be asked to monitor your blood pressure at home for 1 or more weeks. TREATMENT Treating high blood pressure includes making lifestyle changes and possibly taking medicine. Living a healthy lifestyle can help lower high blood pressure. You may need to change some of your habits. Lifestyle changes may include:  Following the DASH diet. This diet is high in fruits, vegetables, and whole grains. It is low in salt, red meat, and added sugars.  Keep your sodium intake below 2,300 mg per day.  Getting at least 30-45 minutes of aerobic exercise at least 4 times per week.  Losing weight if necessary.  Not smoking.  Limiting alcoholic beverages.  Learning ways to reduce stress. Your health care provider may prescribe medicine if lifestyle changes are not enough to get your blood pressure under control, and if one of the following is true:  You are 18-59 years of age and your systolic blood pressure is above 140.  You are 60 years of age or older, and your systolic blood pressure is above 150.  Your diastolic blood pressure is above 90.  You have diabetes, and your systolic blood pressure is over 140 or your diastolic blood pressure is over 90.  You have kidney disease and your blood pressure is above 140/90.  You have heart disease and your blood pressure is above 140/90. Your personal target blood pressure may vary depending on your medical conditions, your age, and other factors. HOME CARE INSTRUCTIONS    Have your blood pressure rechecked as directed by your health care provider.   Take medicines only as directed by your health care provider. Follow the directions carefully. Blood pressure medicines must be taken as prescribed. The medicine does not work as well when you skip doses. Skipping doses also puts you at risk for  problems.  Do not smoke.   Monitor your blood pressure at home as directed by your health care provider. SEEK MEDICAL CARE IF:   You think you are having a reaction to medicines taken.  You have recurrent headaches or feel dizzy.  You have swelling in your ankles.  You have trouble with your vision. SEEK IMMEDIATE MEDICAL CARE IF:  You develop a severe headache or confusion.  You have unusual weakness, numbness, or feel faint.  You have severe chest or abdominal pain.  You vomit repeatedly.  You have trouble breathing. MAKE SURE YOU:   Understand these instructions.  Will watch your condition.  Will get help right away if you are not doing well or get worse.   This information is not intended to replace advice given to you by your health care provider. Make sure you discuss any questions you have with your health care provider.   Document Released: 01/28/2005 Document Revised: 06/14/2014 Document Reviewed: 11/20/2012 Elsevier Interactive Patient Education 2016 [employer]  

## 2015-04-08 ENCOUNTER — Emergency Department (HOSPITAL_COMMUNITY)
Admission: EM | Admit: 2015-04-08 | Discharge: 2015-04-08 | Disposition: A | Discharge status: Home / Self Care | Payer: Medicaid Other | Attending: Emergency Medicine | Admitting: Emergency Medicine

## 2015-04-08 ENCOUNTER — Encounter (HOSPITAL_COMMUNITY): Payer: Self-pay | Admitting: Oncology

## 2015-04-08 ENCOUNTER — Emergency Department (HOSPITAL_COMMUNITY): Payer: Medicaid Other

## 2015-04-08 DIAGNOSIS — R05 Cough: Secondary | ICD-10-CM | POA: Diagnosis present

## 2015-04-08 DIAGNOSIS — I10 Essential (primary) hypertension: Secondary | ICD-10-CM | POA: Diagnosis not present

## 2015-04-08 DIAGNOSIS — R197 Diarrhea, unspecified: Secondary | ICD-10-CM | POA: Diagnosis not present

## 2015-04-08 DIAGNOSIS — J159 Unspecified bacterial pneumonia: Secondary | ICD-10-CM | POA: Diagnosis not present

## 2015-04-08 DIAGNOSIS — Z79899 Other long term (current) drug therapy: Secondary | ICD-10-CM | POA: Diagnosis not present

## 2015-04-08 DIAGNOSIS — F17210 Nicotine dependence, cigarettes, uncomplicated: Secondary | ICD-10-CM | POA: Diagnosis not present

## 2015-04-08 DIAGNOSIS — J45909 Unspecified asthma, uncomplicated: Secondary | ICD-10-CM | POA: Insufficient documentation

## 2015-04-08 DIAGNOSIS — J189 Pneumonia, unspecified organism: Secondary | ICD-10-CM

## 2015-04-08 HISTORY — DX: Unspecified asthma, uncomplicated: J45.909

## 2015-04-08 MED ORDER — PREDNISONE 20 MG PO TABS
60.0000 mg | ORAL_TABLET | Freq: Once | ORAL | Status: AC
  Administered 2015-04-08: 60 mg via ORAL
  Filled 2015-04-08: qty 3

## 2015-04-08 MED ORDER — LEVOFLOXACIN 750 MG PO TABS
750.0000 mg | ORAL_TABLET | Freq: Once | ORAL | Status: AC
  Administered 2015-04-08: 750 mg via ORAL
  Filled 2015-04-08: qty 1

## 2015-04-08 MED ORDER — PREDNISONE 20 MG PO TABS: 40.0000 mg | ORAL_TABLET | Freq: Every day | ORAL | Status: AC

## 2015-04-08 MED ORDER — LEVOFLOXACIN 250 MG PO TABS: 750.0000 mg | ORAL_TABLET | Freq: Every day | ORAL | Status: AC

## 2015-04-08 MED ORDER — BENZONATATE 100 MG PO CAPS: 100.0000 mg | ORAL_CAPSULE | Freq: Three times a day (TID) | ORAL | Status: AC

## 2015-04-08 MED ORDER — HYDROCODONE-HOMATROPINE 5-1.5 MG/5ML PO SYRP
5.0000 mL | ORAL_SOLUTION | Freq: Once | ORAL | Status: AC
Start: 2015-04-08 — End: 2015-04-08
  Administered 2015-04-08: 5 mL via ORAL
  Filled 2015-04-08: qty 5

## 2015-04-08 MED ORDER — BENZONATATE 100 MG PO CAPS
100.0000 mg | ORAL_CAPSULE | Freq: Once | ORAL | Status: AC
  Administered 2015-04-08: 100 mg via ORAL
  Filled 2015-04-08: qty 1

## 2015-04-08 MED ORDER — IPRATROPIUM-ALBUTEROL 0.5-2.5 (3) MG/3ML IN SOLN
3.0000 mL | Freq: Once | RESPIRATORY_TRACT | Status: AC
  Administered 2015-04-08: 3 mL via RESPIRATORY_TRACT
  Filled 2015-04-08: qty 3

## 2015-04-08 NOTE — ED Notes (Signed)
Pt ambulated and maintained 97% O2 sat or higher during ambulation.

## 2015-04-08 NOTE — ED Notes (Signed)
Pt was caring for her son who had flu like sx, which she developed 3 days ago .  Pt reports productive cough w/ yellow sputum, chills, body aches.  Pt is speaking in full sentences.  Lung sounds clear b/l.  Hx of asthma has been using rescue inhaler.

## 2015-04-08 NOTE — ED Provider Notes (Signed)
CSN: [id number]     Arrival date & time 04/08/15  1901 History  By signing my name below, I, [facility], attest that this documentation has been prepared under the direction and in the presence of [employer], PA-C. Electronically Signed: Randell Patient, ED Scribe. 04/08/2015. 8:48 PM.   Chief Complaint  Patient presents with  . flu like sx    The history is provided by the patient. No language interpreter was used.   HPI Comments: Melissa Sloan is a 40 y.o. female with an hx of HTN and asthma who presents to the Emergency Department complaining of intermittent, moderate, gradually worsening cough onset 3 days ago. Patient reports symptoms started with cough productive of yellow mucus and rhinorrhea, followed by SOB, dyspnea,  diarrhea, and chills. She has taken OTC cold medications including Theraflu and used an albuterol inhaler without relief. She notes recent sick contact with her son who had similar symptoms. Per patient, she states that she has an hx of acute bronchitis that periodically flare-ups. She is a current smoker. Denies fever and vomiting.   Past Medical History  Diagnosis Date  . Hypertension   . Asthma    Past Surgical History  Procedure Laterality Date  . Tubal ligation     Family History  Problem Relation Age of Onset  . Hypertension Other    Social History  Substance Use Topics  . Smoking status: Current Every Day Smoker -- 0.40 packs/day    Types: Cigarettes  . Smokeless tobacco: Never Used     Comment: trying to slow down  . Alcohol Use: No     Comment: weekends   OB History    No data available      Review of Systems  Constitutional: Positive for chills. Negative for fever.  HENT: Positive for rhinorrhea.   Respiratory: Positive for cough and shortness of breath.   Gastrointestinal: Positive for diarrhea.  All other systems reviewed and are negative.   Allergies  Review of patient's allergies indicates no known  allergies.  Home Medications   Prior to Admission medications   Medication Sig Start Date End Date Taking? Authorizing Provider  albuterol (PROVENTIL HFA;VENTOLIN HFA) 108 (90 BASE) MCG/ACT inhaler Inhale 1-2 puffs into the lungs every 6 (six) hours as needed for wheezing or shortness of breath. 01/29/14   Derwood Kaplan, MD  benzonatate (TESSALON) 100 MG capsule Take 1 capsule (100 mg total) by mouth every 8 (eight) hours. 04/08/15   Antony Madura, PA-C  hydrochlorothiazide (HYDRODIURIL) 50 MG tablet One tablet a day 03/02/15   Elson Areas, PA-C  levofloxacin (LEVAQUIN) 250 MG tablet Take 3 tablets (750 mg total) by mouth daily. Start the evening of 04/09/15. Take until finished 04/08/15   Antony Madura, PA-C  naproxen sodium (ANAPROX) 220 MG tablet Take 220 mg by mouth 3 (three) times daily as needed (pain).    Historical Provider, MD  nicotine (NICODERM CQ) 21 mg/24hr patch Place 1 patch (21 mg total) onto the skin daily. Patient not taking: Reported on 01/29/2014 05/04/13   Ivonne Andrew, PA-C  nicotine polacrilex (NICORETTE) 4 MG gum Take 1 each (4 mg total) by mouth as needed for smoking cessation. Patient not taking: Reported on 01/29/2014 05/04/13   Ivonne Andrew, PA-C  predniSONE (DELTASONE) 20 MG tablet Take 2 tablets (40 mg total) by mouth daily. 04/08/15   Antony Madura, PA-C  traMADol (ULTRAM) 50 MG tablet Take 50 mg by mouth every 12 (twelve) hours as needed for moderate pain.  Historical Provider, MD   BP 128/86 mmHg  Pulse 84  Temp(Src) 98.7 F (37.1 C) (Oral)  Resp 18  Ht  (1.575 m)  Wt 97.523 kg  BMI 39.31 kg/m2  SpO2 99%  LMP 03/20/2015 (Approximate)   Physical Exam  Constitutional: She is oriented to person, place, and time. She appears well-developed and well-nourished. No distress.  Nontoxic/nonseptic appearing  HENT:  Head: Normocephalic and atraumatic.  Mouth/Throat: Oropharynx is clear and moist. No oropharyngeal exudate.  Patient tolerating secretions without  difficulty.  Eyes: Conjunctivae and EOM are normal. No scleral icterus.  Neck: Normal range of motion. Neck supple.  Cardiovascular: Normal rate, regular rhythm and intact distal pulses.   Pulmonary/Chest: Effort normal. No respiratory distress. She has wheezes.  Faint expiratory wheezes diffusely. No tachypnea or dyspnea. Chest expansion symmetric.  Musculoskeletal: Normal range of motion.  Neurological: She is alert and oriented to person, place, and time. She exhibits normal muscle tone. Coordination normal.  Ambulatory with steady gait.  Skin: Skin is warm and dry. No rash noted. She is not diaphoretic. No erythema. No pallor.  Psychiatric: She has a normal mood and affect. Her behavior is normal.  Nursing note and vitals reviewed.   ED Course  Procedures   DIAGNOSTIC STUDIES: Oxygen Saturation is 99% on RA, normal by my interpretation.    COORDINATION OF CARE: 8:36 PM Discussed results of chest x-ray. Discussed ordering breathing treatment and pt agreed. Will order and prescribe medications. Discussed treatment plan with pt at bedside and pt agreed to plan.  9:28 PM Patient ambulatory with steady gait. Oxygen sats at or above 97% with ambulation.  Labs Review Labs Reviewed - No data to display  Imaging Review Dg Chest 2 View  04/08/2015  CLINICAL DATA:  Flu-like symptoms with productive cough EXAM: CHEST  2 VIEW COMPARISON:  01/29/2014 FINDINGS: Cardiac shadow is within normal limits. The lungs are well aerated bilaterally. Mild right middle lobe infiltrate is noted laterally. No bony abnormality is noted. IMPRESSION: Right middle lobe pneumonia. Electronically Signed   By: Alcide Clever M.D.   On: 04/08/2015 20:32   I have personally reviewed and evaluated these images and lab results as part of my medical decision-making.   EKG Interpretation None       Medications  ipratropium-albuterol (DUONEB) 0.5-2.5 (3) MG/3ML nebulizer solution 3 mL (3 mLs Nebulization Given  04/08/15 2108)  predniSONE (DELTASONE) tablet 60 mg (60 mg Oral Given 04/08/15 2108)  levofloxacin (LEVAQUIN) tablet 750 mg (750 mg Oral Given 04/08/15 2108)  benzonatate (TESSALON) capsule 100 mg (100 mg Oral Given 04/08/15 2108)  HYDROcodone-homatropine (HYCODAN) 5-1.5 MG/5ML syrup 5 mL (5 mLs Oral Given 04/08/15 2107)    MDM   Final diagnoses:  CAP (community acquired pneumonia)    40 year old female presents to the emergency department for upper respiratory symptoms and cough x 3 days. Chest x-ray reveals a right middle lobe pneumonia. Patient is afebrile with stable vital signs. She has no hypoxia at rest or with ambulation. Will start the patient on Levaquin and discharge with additional prescriptions for supportive care. Return precautions discussed and provided. Patient discharged in good condition with no unaddressed concerns.  I personally performed the services described in this documentation, which was scribed in my presence. The recorded information has been reviewed and is accurate.    Filed Vitals:   04/08/15 1928  BP: 128/86  Pulse: 84  Temp: 98.7 F (37.1 C)  TempSrc: Oral  Resp: 18  Height:  (  1.575 m)  Weight: 97.523 kg  SpO2: 99%      Antony Madura, PA-C 04/08/15 2130  Marily Memos, MD 04/09/15 [phone]

## 2015-04-08 NOTE — Discharge Instructions (Signed)

## 2015-04-12 DEATH — deceased

## 2017-10-20 IMAGING — CT CT NECK W/ CM
4 of 5 series · 14 of 33 positions shown, 16 images · IV contrast (OMNIPAQUE 300)
Comparison: CT neck April 27, 2014

CLINICAL DATA: RIGHT neck abscess for 3 days, pending surgical
evaluation. History of similar symptoms, spontaneously resolved.
History of HIV, hypertension.

EXAM:
CT NECK WITH CONTRAST
TECHNIQUE: Multidetector CT imaging of the neck was performed using the
standard protocol following the bolus administration of intravenous
contrast.
CONTRAST:  75mL OMNIPAQUE IOHEXOL 300 MG/ML  SOLN

[Series 3: neck with st · axial · 0.39mm/px · z∈[-726,-606]mm · 4 of 102 slices shown, 5 images]
[im 21/102  soft-tissue]
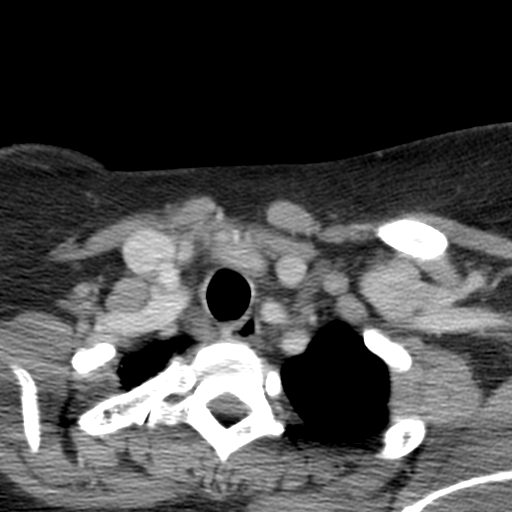
[im 21/102  bone]
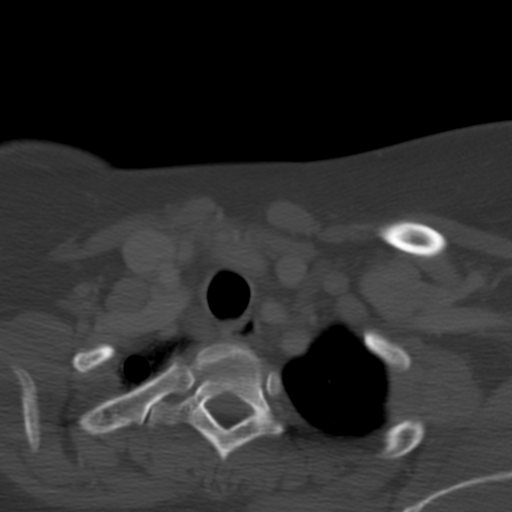
[im 41/102  bone]
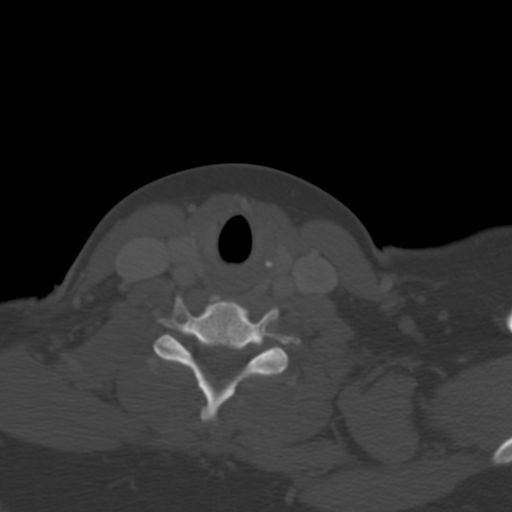
[im 61/102  bone]
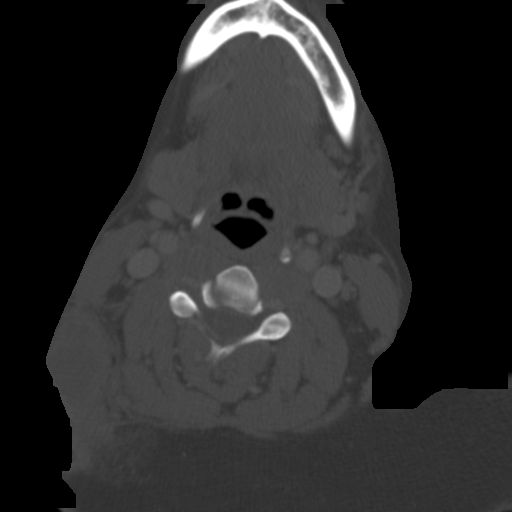
[im 81/102  bone]
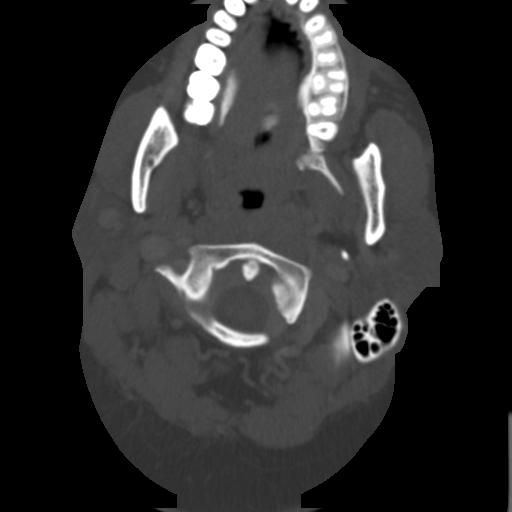

[Series 4: coronal st · coronal · 0.32mm/px · 3 of 101 slices shown]
[im 27/101  bone]
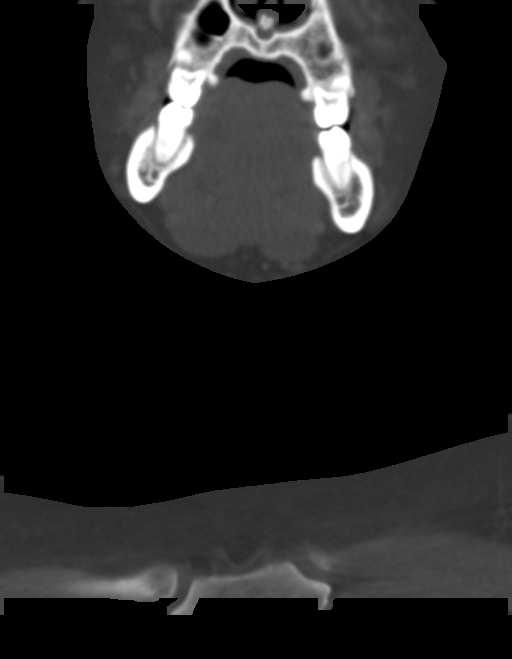
[im 43/101  bone]
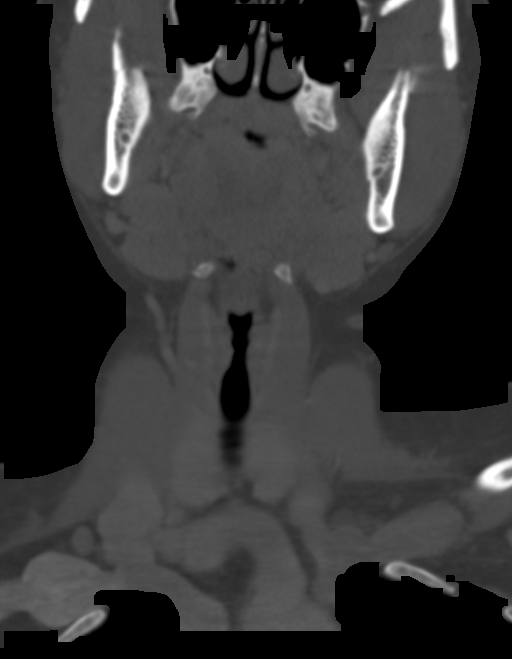
[im 59/101  bone]
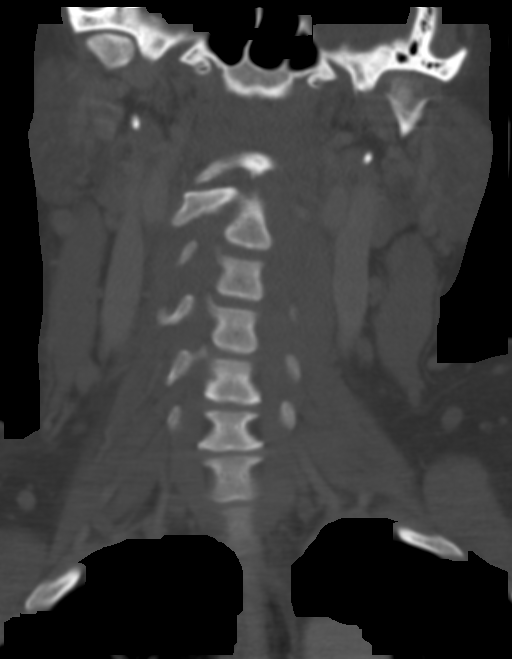

[Series 5: sagittal st · sagittal · 0.33mm/px · 5 of 101 slices shown, 6 images]
[im 34/101  bone]
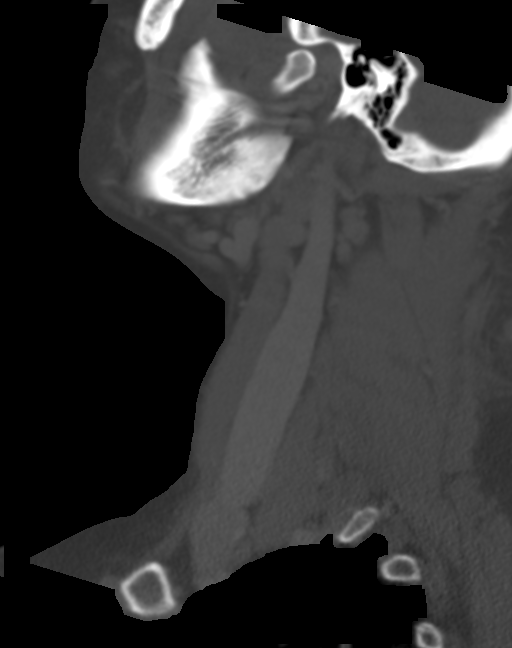
[im 42/101  bone]
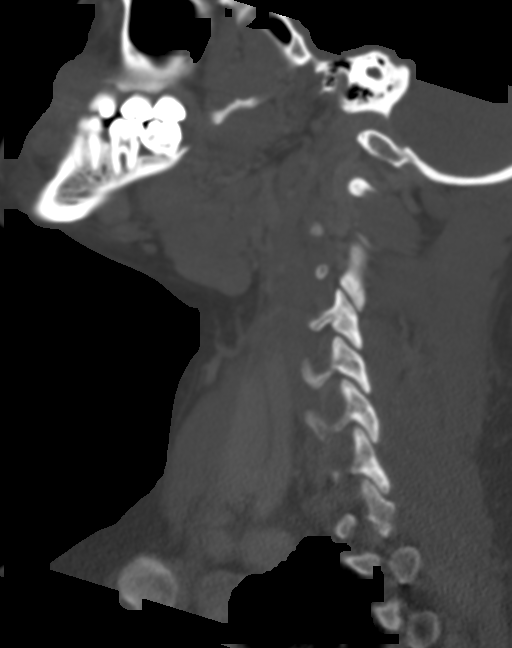
[im 51/101  soft-tissue]
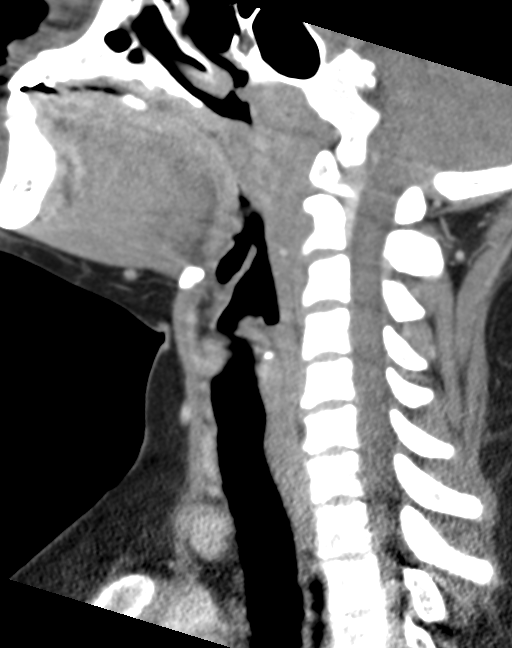
[im 51/101  bone]
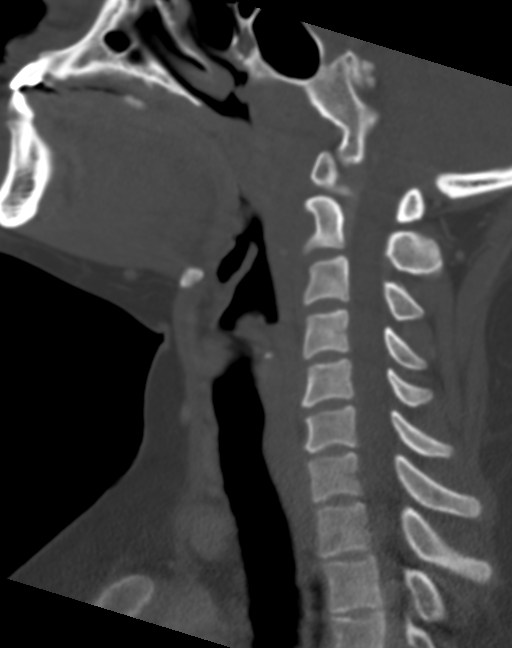
[im 59/101  bone]
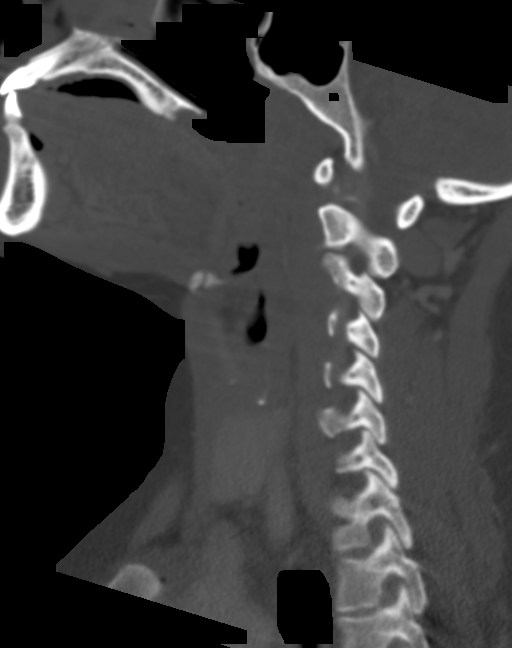
[im 67/101  bone]
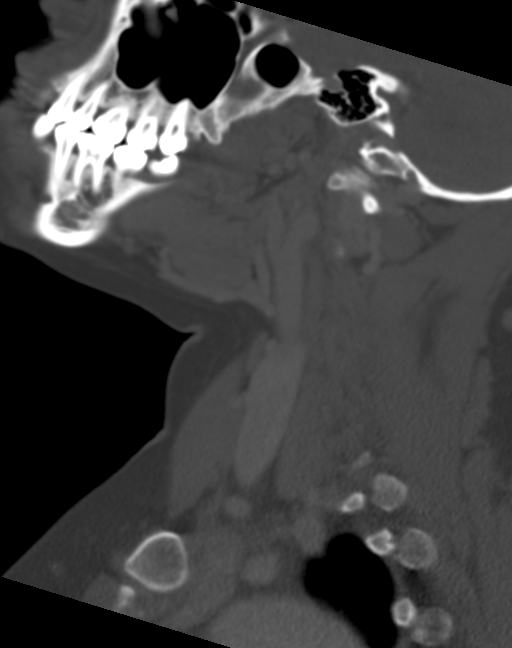

[Series 6: axial recons · axial · 0.29mm/px · z∈[-756,-717]mm · 2 of 101 slices shown]
[im 21/101  bone]
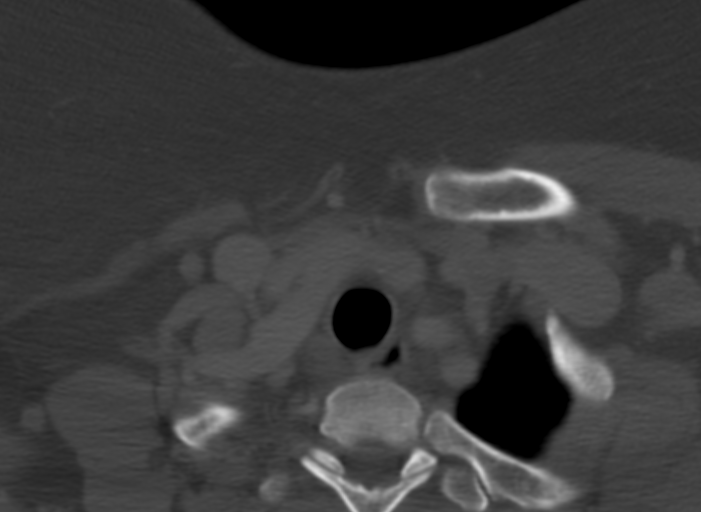
[im 41/101  bone]
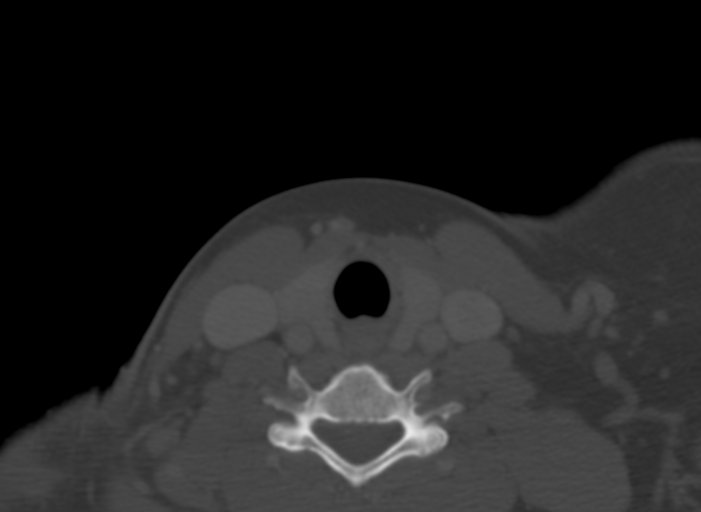

[14 of 33 positions shown; findings below may reference images not displayed]

FINDINGS: Pharynx and larynx: Fullness of the nasopharyngeal soft tissues can
be seen with patient's known HIV. Aerodigestive tract is otherwise
unremarkable.

Salivary glands: Normal.

Thyroid: Normal.

Lymph nodes: Multiple RIGHT level 5a hyper enhancing lymph nodes
measure up to 12 mm short axis.

Vascular: Normal.

Limited intracranial: Normal.

Mastoids and visualized paranasal sinuses: Well aerated.

Skeleton/soft tissues: Within RIGHT posterior neck is (corresponding
to skin marker) subcutaneous fat, posterior to the
sternocleidomastoid muscle is a 3.7 x 2.5 cm (AP by transverse) rim
enhancing fluid collection, in similar location to prior abscess
which measured 2.8 x 1.6 cm. Overlying skin thickening compatible
with cellulitis. No subcutaneous gas radiopaque foreign bodies. No
definite subfascial invasion. Straightened cervical lordosis. No
destructive bony lesions.

Upper chest: Minimal RIGHT apical bullous changes. No superior
mediastinal lymphadenopathy.
IMPRESSION: Recurrent 3.7 x 2.5 cm superficial abscess in RIGHT posterolateral
neck subcutaneous fat without subfascial extent. RIGHT level 5 A
lymphadenopathy is likely reactive.
